# Patient Record
Sex: Female | Born: 1985 | Race: White | Hispanic: Yes | Smoking: Former smoker
Health system: Southern US, Community
[De-identification: ages and names within clinical notes are randomized; demographics above are authoritative.]

---

## 2012-10-06 DIAGNOSIS — Z3202 Encounter for pregnancy test, result negative: Secondary | ICD-10-CM | POA: Insufficient documentation

## 2013-02-18 DIAGNOSIS — Z30431 Encounter for routine checking of intrauterine contraceptive device: Secondary | ICD-10-CM | POA: Insufficient documentation

## 2013-02-18 DIAGNOSIS — N760 Acute vaginitis: Secondary | ICD-10-CM | POA: Insufficient documentation

## 2013-02-18 DIAGNOSIS — N920 Excessive and frequent menstruation with regular cycle: Secondary | ICD-10-CM | POA: Insufficient documentation

## 2013-02-18 DIAGNOSIS — Z113 Encounter for screening for infections with a predominantly sexual mode of transmission: Secondary | ICD-10-CM | POA: Insufficient documentation

## 2013-02-18 DIAGNOSIS — F329 Major depressive disorder, single episode, unspecified: Secondary | ICD-10-CM | POA: Insufficient documentation

## 2013-02-18 DIAGNOSIS — N912 Amenorrhea, unspecified: Secondary | ICD-10-CM | POA: Insufficient documentation

## 2013-02-18 DIAGNOSIS — F411 Generalized anxiety disorder: Secondary | ICD-10-CM | POA: Insufficient documentation

## 2013-02-18 DIAGNOSIS — N63 Unspecified lump in unspecified breast: Secondary | ICD-10-CM | POA: Insufficient documentation

## 2016-09-24 DIAGNOSIS — J320 Chronic maxillary sinusitis: Secondary | ICD-10-CM | POA: Insufficient documentation

## 2017-01-25 DIAGNOSIS — R0982 Postnasal drip: Secondary | ICD-10-CM | POA: Insufficient documentation

## 2017-05-03 DIAGNOSIS — K219 Gastro-esophageal reflux disease without esophagitis: Secondary | ICD-10-CM | POA: Insufficient documentation

## 2017-12-13 DIAGNOSIS — O24410 Gestational diabetes mellitus in pregnancy, diet controlled: Secondary | ICD-10-CM | POA: Insufficient documentation

## 2017-12-20 DIAGNOSIS — R801 Persistent proteinuria, unspecified: Secondary | ICD-10-CM | POA: Insufficient documentation

## 2017-12-20 DIAGNOSIS — Z3A26 26 weeks gestation of pregnancy: Secondary | ICD-10-CM | POA: Insufficient documentation

## 2017-12-29 ENCOUNTER — Other Ambulatory Visit (HOSPITAL_COMMUNITY): Payer: Self-pay | Admitting: Specialist

## 2017-12-29 DIAGNOSIS — O289 Unspecified abnormal findings on antenatal screening of mother: Secondary | ICD-10-CM

## 2018-01-06 ENCOUNTER — Encounter (HOSPITAL_COMMUNITY): Payer: Self-pay

## 2018-01-06 ENCOUNTER — Other Ambulatory Visit (HOSPITAL_COMMUNITY): Payer: Self-pay

## 2018-01-18 DIAGNOSIS — O4703 False labor before 37 completed weeks of gestation, third trimester: Secondary | ICD-10-CM | POA: Insufficient documentation

## 2018-02-04 ENCOUNTER — Ambulatory Visit (HOSPITAL_COMMUNITY): Payer: Self-pay

## 2018-02-06 DIAGNOSIS — Z3490 Encounter for supervision of normal pregnancy, unspecified, unspecified trimester: Secondary | ICD-10-CM | POA: Insufficient documentation

## 2018-02-11 ENCOUNTER — Other Ambulatory Visit (HOSPITAL_COMMUNITY): Payer: Self-pay

## 2019-07-03 DIAGNOSIS — H5213 Myopia, bilateral: Secondary | ICD-10-CM | POA: Diagnosis not present

## 2019-07-03 DIAGNOSIS — H52223 Regular astigmatism, bilateral: Secondary | ICD-10-CM | POA: Diagnosis not present

## 2019-07-06 DIAGNOSIS — H5213 Myopia, bilateral: Secondary | ICD-10-CM | POA: Diagnosis not present

## 2019-09-14 DIAGNOSIS — H547 Unspecified visual loss: Secondary | ICD-10-CM | POA: Diagnosis not present

## 2019-10-31 DIAGNOSIS — Z8709 Personal history of other diseases of the respiratory system: Secondary | ICD-10-CM | POA: Diagnosis not present

## 2019-10-31 DIAGNOSIS — F458 Other somatoform disorders: Secondary | ICD-10-CM | POA: Insufficient documentation

## 2019-10-31 DIAGNOSIS — K219 Gastro-esophageal reflux disease without esophagitis: Secondary | ICD-10-CM | POA: Diagnosis not present

## 2019-10-31 DIAGNOSIS — H9203 Otalgia, bilateral: Secondary | ICD-10-CM | POA: Insufficient documentation

## 2019-10-31 DIAGNOSIS — R29898 Other symptoms and signs involving the musculoskeletal system: Secondary | ICD-10-CM | POA: Diagnosis not present

## 2019-10-31 DIAGNOSIS — R0982 Postnasal drip: Secondary | ICD-10-CM | POA: Diagnosis not present

## 2019-10-31 DIAGNOSIS — Z87891 Personal history of nicotine dependence: Secondary | ICD-10-CM | POA: Diagnosis not present

## 2020-05-10 ENCOUNTER — Telehealth: Payer: Self-pay

## 2020-05-10 NOTE — Telephone Encounter (Signed)
Kelli Hill called requesting Dr. Sheppard Coil take her as a new patient.  She stated that she understood Dr. Sheppard Coil no longer took new patient's but she wanted her to reconsider in this instance. Dr. Sheppard Coil comes highly recommended, "by about 70 other moms on our Facebook chatroom". She states, "I'm no trouble just regular run of the [facility]."  Forwarded message to Dr. Sheppard Coil for her consideration. CMA to respond to patient with Dr. Redgie Grayer determination.

## 2020-05-14 NOTE — Telephone Encounter (Signed)
Sure can make exception

## 2020-05-14 NOTE — Telephone Encounter (Signed)
Please call patient to schedule an new patient appointment

## 2020-05-14 NOTE — Telephone Encounter (Signed)
PT advised. "Thank you so much for letting me in."

## 2020-05-29 ENCOUNTER — Other Ambulatory Visit: Payer: Self-pay

## 2020-05-29 ENCOUNTER — Encounter: Payer: Self-pay | Admitting: Osteopathic Medicine

## 2020-05-29 ENCOUNTER — Ambulatory Visit (INDEPENDENT_AMBULATORY_CARE_PROVIDER_SITE_OTHER): Payer: Medicaid Other | Admitting: Osteopathic Medicine

## 2020-05-29 VITALS — BP 136/96 | HR 73 | Temp 97.0°F | Ht 61.5 in | Wt 216.0 lb

## 2020-05-29 DIAGNOSIS — Z8049 Family history of malignant neoplasm of other genital organs: Secondary | ICD-10-CM | POA: Diagnosis not present

## 2020-05-29 DIAGNOSIS — Z3009 Encounter for other general counseling and advice on contraception: Secondary | ICD-10-CM | POA: Diagnosis not present

## 2020-05-29 DIAGNOSIS — M546 Pain in thoracic spine: Secondary | ICD-10-CM | POA: Diagnosis not present

## 2020-05-29 DIAGNOSIS — D509 Iron deficiency anemia, unspecified: Secondary | ICD-10-CM | POA: Diagnosis not present

## 2020-05-29 DIAGNOSIS — K219 Gastro-esophageal reflux disease without esophagitis: Secondary | ICD-10-CM | POA: Diagnosis not present

## 2020-05-29 DIAGNOSIS — E7800 Pure hypercholesterolemia, unspecified: Secondary | ICD-10-CM | POA: Diagnosis not present

## 2020-05-29 DIAGNOSIS — G8929 Other chronic pain: Secondary | ICD-10-CM

## 2020-05-29 DIAGNOSIS — E282 Polycystic ovarian syndrome: Secondary | ICD-10-CM | POA: Diagnosis not present

## 2020-05-29 DIAGNOSIS — B354 Tinea corporis: Secondary | ICD-10-CM | POA: Diagnosis not present

## 2020-05-29 DIAGNOSIS — N644 Mastodynia: Secondary | ICD-10-CM

## 2020-05-29 DIAGNOSIS — N62 Hypertrophy of breast: Secondary | ICD-10-CM

## 2020-05-29 DIAGNOSIS — R7309 Other abnormal glucose: Secondary | ICD-10-CM | POA: Diagnosis not present

## 2020-05-29 NOTE — Progress Notes (Signed)
Kelli Hill is a 34 y.o. female who presents to  [facility]  today, 05/29/20, seeking care for the following:  . New to establish  . Breast pain on L - shooting pain nipple to back, tender to touch and seems sensitive to temperature changes . Intermittent abdominal pain in area of C-section scar        ASSESSMENT & PLAN with other pertinent findings:  The primary encounter diagnosis was Breast pain, left. Diagnoses of PCOS (polycystic ovarian syndrome), Tinea corporis, Gastroesophageal reflux disease, unspecified whether esophagitis present, Encounter for counseling regarding contraception, and Family history of uterine cancer were also pertinent to this visit.   1. Breast pain, left Normal exam Large breasts w/ associated back pain, referral for reduction   2. PCOS (polycystic ovarian syndrome) Longstanding irregular periods and hirsutism  OK w/ another pregnancy, not on contraception, reluctant to revisit OCP d/t previous side effects, Mirena worked well before   3. Gestational diabetes history  Labs as below  4. Gastroesophageal reflux disease, unspecified whether esophagitis present Frequent TUMS use, may consider PPI  5. Encounter for counseling regarding contraception See above   6. Family history of uterine cancer   There are no Patient Instructions on file for this visit.  Orders Placed This Encounter  Procedures  . MM Digital Diagnostic Bilat  . US BREAST COMPLETE UNI RIGHT INC AXILLA  . US BREAST COMPLETE UNI LEFT INC AXILLA  . CBC  . COMPLETE METABOLIC PANEL WITH GFR  . Lipid panel  . TSH  . Hemoglobin A1c  . Pregnancy, urine    No orders of the defined types were placed in this encounter.      Follow-up instructions: Return for RECHECK PENDING RESULTS / IF WORSE OR CHANGE. ANNUAL ONCE PER YEAR.                                         BP (!) 136/96 (BP  Location: Right Arm, Patient Position: Sitting)   Pulse 73   Temp (!) 97 F (36.1 C)   Ht 5' 1.5" (1.562 m)   Wt 216 lb (98 kg)   SpO2 100%   BMI 40.15 kg/m   No outpatient medications have been marked as taking for the 05/29/20 encounter (Office Visit) with Emeterio Reeve, DO.     No results found.     All questions at time of visit were answered - patient instructed to contact office with any additional concerns or updates.  ER/RTC precautions were reviewed with the patient as applicable.   Please note: voice recognition software was used to produce this document, and typos may escape review. Please contact Dr. Sheppard Coil for any needed clarifications.

## 2020-05-30 ENCOUNTER — Other Ambulatory Visit: Payer: Self-pay | Admitting: Osteopathic Medicine

## 2020-05-30 DIAGNOSIS — N644 Mastodynia: Secondary | ICD-10-CM

## 2020-05-30 LAB — PREGNANCY, URINE: Preg Test, Ur: NEGATIVE

## 2020-05-30 NOTE — Addendum Note (Signed)
Addended by: Maryla Morrow on: 05/30/2020 05:11 PM   Modules accepted: Orders

## 2020-06-02 LAB — COMPLETE METABOLIC PANEL WITH GFR
AG Ratio: 1.5 (calc) (ref 1.0–2.5)
ALT: 13 U/L (ref 6–29)
AST: 14 U/L (ref 10–30)
Albumin: 3.9 g/dL (ref 3.6–5.1)
Alkaline phosphatase (APISO): 84 U/L (ref 31–125)
BUN/Creatinine Ratio: 13 (calc) (ref 6–22)
BUN: 15 mg/dL (ref 7–25)
CO2: 26 mmol/L (ref 20–32)
Calcium: 9.6 mg/dL (ref 8.6–10.2)
Chloride: 104 mmol/L (ref 98–110)
Creat: 1.19 mg/dL — ABNORMAL HIGH (ref 0.50–1.10)
GFR, Est African American: 70 mL/min/{1.73_m2} (ref >=60)
GFR, Est Non African American: 60 mL/min/{1.73_m2} (ref >=60)
Globulin: 2.6 g/dL (calc) (ref 1.9–3.7)
Glucose, Bld: 97 mg/dL (ref 65–139)
Potassium: 4.4 mmol/L (ref 3.5–5.3)
Sodium: 137 mmol/L (ref 135–146)
Total Bilirubin: 0.4 mg/dL (ref 0.2–1.2)
Total Protein: 6.5 g/dL (ref 6.1–8.1)

## 2020-06-02 LAB — CBC
HCT: 33.7 % — ABNORMAL LOW (ref 35.0–45.0)
Hemoglobin: 10.6 g/dL — ABNORMAL LOW (ref 11.7–15.5)
MCH: 23.9 pg — ABNORMAL LOW (ref 27.0–33.0)
MCHC: 31.5 g/dL — ABNORMAL LOW (ref 32.0–36.0)
MCV: 76.1 fL — ABNORMAL LOW (ref 80.0–100.0)
MPV: 9.4 fL (ref 7.5–12.5)
Platelets: 371 10*3/uL (ref 140–400)
RBC: 4.43 10*6/uL (ref 3.80–5.10)
RDW: 14.8 % (ref 11.0–15.0)
WBC: 5.7 10*3/uL (ref 3.8–10.8)

## 2020-06-02 LAB — LIPID PANEL
Cholesterol: 248 mg/dL — ABNORMAL HIGH (ref <200)
HDL: 63 mg/dL (ref >=50)
LDL Cholesterol (Calc): 158 mg/dL (calc) — ABNORMAL HIGH
Non-HDL Cholesterol (Calc): 185 mg/dL (calc) — ABNORMAL HIGH (ref <130)
Total CHOL/HDL Ratio: 3.9 (calc) (ref <5.0)
Triglycerides: 142 mg/dL (ref <150)

## 2020-06-02 LAB — HEMOGLOBIN A1C
Hgb A1c MFr Bld: 5.3 % of total Hgb (ref <5.7)
Mean Plasma Glucose: 105 (calc)
eAG (mmol/L): 5.8 (calc)

## 2020-06-02 LAB — TEST AUTHORIZATION

## 2020-06-02 LAB — IRON,TIBC AND FERRITIN PANEL
%SAT: 6 % (calc) — ABNORMAL LOW (ref 16–45)
Ferritin: 5 ng/mL — ABNORMAL LOW (ref 16–154)
Iron: 30 ug/dL — ABNORMAL LOW (ref 40–190)
TIBC: 464 mcg/dL (calc) — ABNORMAL HIGH (ref 250–450)

## 2020-06-02 LAB — TSH: TSH: 2.43 mIU/L

## 2020-06-17 ENCOUNTER — Ambulatory Visit: Payer: Medicaid Other

## 2020-06-17 ENCOUNTER — Other Ambulatory Visit: Payer: Self-pay

## 2020-06-17 ENCOUNTER — Ambulatory Visit
Admission: RE | Admit: 2020-06-17 | Discharge: 2020-06-17 | Disposition: A | Discharge status: Home / Self Care | Payer: Medicaid Other | Source: Ambulatory Visit | Attending: Osteopathic Medicine | Admitting: Osteopathic Medicine

## 2020-06-17 DIAGNOSIS — N644 Mastodynia: Secondary | ICD-10-CM | POA: Diagnosis not present

## 2020-06-24 ENCOUNTER — Encounter: Payer: Self-pay | Admitting: Osteopathic Medicine

## 2020-06-24 ENCOUNTER — Ambulatory Visit (INDEPENDENT_AMBULATORY_CARE_PROVIDER_SITE_OTHER): Payer: Medicaid Other | Admitting: Osteopathic Medicine

## 2020-06-24 VITALS — BP 110/77 | HR 92 | Wt 216.0 lb

## 2020-06-24 DIAGNOSIS — K219 Gastro-esophageal reflux disease without esophagitis: Secondary | ICD-10-CM

## 2020-06-24 DIAGNOSIS — D509 Iron deficiency anemia, unspecified: Secondary | ICD-10-CM | POA: Insufficient documentation

## 2020-06-24 DIAGNOSIS — E282 Polycystic ovarian syndrome: Secondary | ICD-10-CM | POA: Diagnosis not present

## 2020-06-24 DIAGNOSIS — Z6841 Body Mass Index (BMI) 40.0 and over, adult: Secondary | ICD-10-CM | POA: Diagnosis not present

## 2020-06-24 DIAGNOSIS — M7712 Lateral epicondylitis, left elbow: Secondary | ICD-10-CM

## 2020-06-24 DIAGNOSIS — M546 Pain in thoracic spine: Secondary | ICD-10-CM | POA: Insufficient documentation

## 2020-06-24 DIAGNOSIS — N644 Mastodynia: Secondary | ICD-10-CM | POA: Insufficient documentation

## 2020-06-24 DIAGNOSIS — G8929 Other chronic pain: Secondary | ICD-10-CM | POA: Insufficient documentation

## 2020-06-24 DIAGNOSIS — Z8049 Family history of malignant neoplasm of other genital organs: Secondary | ICD-10-CM | POA: Insufficient documentation

## 2020-06-24 NOTE — Progress Notes (Signed)
Kelli Hill is a 34 y.o. female who presents to  [facility] [facility]  today, 06/24/20, seeking care for the following:  . Arm pain - L elbow pain ongoing x3 months.  Exam positive for lateral epicondylitis.  Weight - seems to be gaining weight despite diet/exercise.  PCOS has made things challenging, patient is feeling very down on herself because of her weight, significant depression/anxiety.  Reluctant to trial medications for weight/mood    ASSESSMENT & PLAN with other pertinent findings:  The primary encounter diagnosis was BMI 40.0-44.9, adult ([place]). Diagnoses of PCOS (polycystic ovarian syndrome), Gastroesophageal reflux disease, unspecified whether esophagitis present, and Lateral epicondylitis of left elbow were also pertinent to this visit.   Given reluctance to start medications, I think a discussion with bariatric specialist about nonmedication management options might be beneficial, especially in light of her PCOS.  Patient is advised that if mental health deteriorates such that she would like to talk more about potential medication or therapy/counseling, let me know.  Home exercises provided for lateral epicondylitis, advised follow-up with sports medicine if no improvement.  No results found for this or any previous visit (from the past 24 hour(s)).  Wt Readings from Last 3 Encounters:  06/24/20 216 lb (98 kg)  05/29/20 216 lb (98 kg)    BP 110/77 (BP Location: Left Arm, Patient Position: Sitting)   Pulse 92   Wt 216 lb (98 kg)   LMP 06/02/2020   SpO2 97%   BMI 40.15 kg/m    Patient Instructions  Weight loss: important things to remember  Things to remember for exercise for weight loss:   Please note - I am not a certified personal trainer. I can present you with ideas and general workout goals, but an exercise program is largely up to you. Find something you can stick with, and something you enjoy!   Slow  progression will help prevent injury!  As you progress in your exercise regimen think about gradually increasing the following, week by week:   intensity (how strenuous is your workout?)  frequency (how often are you exercising?)  duration (how many minutes at a time are you exercising?)  Walking for 20 minutes a day is certainly better than nothing, but more strenuous exercise will develop better cardiovascular fitness.   interval training (high-intensity alternating with low-intensity, think walk/jog rather than just walk)  muscle strengthening exercises (weight lifting, calisthenics, yoga) - this also helps prevent osteoporosis!   Things to remember for diet changes for weight loss:   Please note - I am not a certified dietician. I can present you with ideas and general diet goals, but a meal plan is largely up to you. I am happy to refer you to a dietician who can give you a detailed meal plan.  Apps/logs can be helpful to track how you're eating! It's not realistic to be logging everything you eat forever, but when you're starting a healthy eating lifestyle it's very helpful, and checking in with logs now and then helps you stick to your program! (Logging calories for the first week of every month, for example)   Calorie restriction / portion control with the goal weight loss of no more than one to one and a half pounds per week. MAX 2 pounds per week.   Increase lean protein such as chicken, fish, [place].   Decrease fatty foods such as heavy dairy, butter.   Decrease sugary foods and sweets. Avoid sugary  drinks such as soda or juice.  Increase fiber found in vegetables, whole grains. Fruits are ok but can be sugary.   Adding psyllium fiber (Metamucil, others) prior to meals can help you feel more full and help you eat less. Add this gradually to avoid bloating.   Medications approved for long-term use for obesity  Qsymia (Phentermine + Topiramate) - may help  migraines/headaches  Saxenda (Liraglutide daily injections) - will also help diabetes/prediabetes  Wegovy (Semaglutide weekly injections) - will also help diabetes/prediabetes  Contrave (Bupropion + Naltrexone) - may help mental health/depression   Orlistat (Xenical Rx, Alli OTC) - not my favorite, can cause diarrhea  Bupropion (Wellbutrin) - generic antidepressant, will also help mental health, may help with quitting smoking  I recommend that you research the above medications and see which one(s) your insurance may or may not cover: If you call your insurance, ask them specifically what medications are on their formulary that are approved for obesity treatment. They should be able to send you a list or tell you over the phone. Remember, medications aren't magic! You MUST be diligent about lifestyle changes as well!      Orders Placed This Encounter  Procedures  . Ambulatory referral to [facility]    No orders of the defined types were placed in this encounter.      Follow-up instructions: Return if symptoms worsen or fail to improve.                                         BP 110/77 (BP Location: Left Arm, Patient Position: Sitting)   Pulse 92   Wt 216 lb (98 kg)   LMP 06/02/2020   SpO2 97%   BMI 40.15 kg/m   No outpatient medications have been marked as taking for the 06/24/20 encounter (Office Visit) with Emeterio Reeve, DO.    No results found for this or any previous visit (from the past 72 hour(s)).  No results found.     All questions at time of visit were answered - patient instructed to contact office with any additional concerns or updates.  ER/RTC precautions were reviewed with the patient as applicable.   Please note: voice recognition software was used to produce this document, and typos may escape review. Please contact Dr. Sheppard Coil for any needed clarifications.   Total encounter time: 40 minutes.

## 2020-06-24 NOTE — Patient Instructions (Addendum)
Weight loss: important things to remember  Things to remember for exercise for weight loss:   Please note - I am not a certified personal trainer. I can present you with ideas and general workout goals, but an exercise program is largely up to you. Find something you can stick with, and something you enjoy!   Slow progression will help prevent injury!  As you progress in your exercise regimen think about gradually increasing the following, week by week:   intensity (how strenuous is your workout?)  frequency (how often are you exercising?)  duration (how many minutes at a time are you exercising?)  Walking for 20 minutes a day is certainly better than nothing, but more strenuous exercise will develop better cardiovascular fitness.   interval training (high-intensity alternating with low-intensity, think walk/jog rather than just walk)  muscle strengthening exercises (weight lifting, calisthenics, yoga) - this also helps prevent osteoporosis!   Things to remember for diet changes for weight loss:   Please note - I am not a certified dietician. I can present you with ideas and general diet goals, but a meal plan is largely up to you. I am happy to refer you to a dietician who can give you a detailed meal plan.  Apps/logs can be helpful to track how you're eating! It's not realistic to be logging everything you eat forever, but when you're starting a healthy eating lifestyle it's very helpful, and checking in with logs now and then helps you stick to your program! (Logging calories for the first week of every month, for example)   Calorie restriction / portion control with the goal weight loss of no more than one to one and a half pounds per week. MAX 2 pounds per week.   Increase lean protein such as chicken, fish, [place].   Decrease fatty foods such as heavy dairy, butter.   Decrease sugary foods and sweets. Avoid sugary drinks such as soda or juice.  Increase fiber found in  vegetables, whole grains. Fruits are ok but can be sugary.   Adding psyllium fiber (Metamucil, others) prior to meals can help you feel more full and help you eat less. Add this gradually to avoid bloating.   Medications approved for long-term use for obesity  Qsymia (Phentermine + Topiramate) - may help migraines/headaches  Saxenda (Liraglutide daily injections) - will also help diabetes/prediabetes  Wegovy (Semaglutide weekly injections) - will also help diabetes/prediabetes  Contrave (Bupropion + Naltrexone) - may help mental health/depression   Orlistat (Xenical Rx, Alli OTC) - not my favorite, can cause diarrhea  Bupropion (Wellbutrin) - generic antidepressant, will also help mental health, may help with quitting smoking  I recommend that you research the above medications and see which one(s) your insurance may or may not cover: If you call your insurance, ask them specifically what medications are on their formulary that are approved for obesity treatment. They should be able to send you a list or tell you over the phone. Remember, medications aren't magic! You MUST be diligent about lifestyle changes as well!

## 2020-07-08 ENCOUNTER — Other Ambulatory Visit: Payer: Self-pay

## 2020-07-08 ENCOUNTER — Encounter: Payer: Self-pay | Admitting: Plastic Surgery

## 2020-07-08 ENCOUNTER — Ambulatory Visit (INDEPENDENT_AMBULATORY_CARE_PROVIDER_SITE_OTHER): Payer: Medicaid Other | Admitting: Plastic Surgery

## 2020-07-08 VITALS — BP 138/98 | HR 92 | Temp 98.2°F | Ht 61.0 in | Wt 218.0 lb

## 2020-07-08 DIAGNOSIS — M546 Pain in thoracic spine: Secondary | ICD-10-CM | POA: Diagnosis not present

## 2020-07-08 DIAGNOSIS — N62 Hypertrophy of breast: Secondary | ICD-10-CM | POA: Diagnosis not present

## 2020-07-08 DIAGNOSIS — M4004 Postural kyphosis, thoracic region: Secondary | ICD-10-CM

## 2020-07-08 DIAGNOSIS — M545 Low back pain, unspecified: Secondary | ICD-10-CM | POA: Diagnosis not present

## 2020-07-08 DIAGNOSIS — M793 Panniculitis, unspecified: Secondary | ICD-10-CM

## 2020-07-08 NOTE — Progress Notes (Signed)
Referring Provider Emeterio Reeve, [address] [place],  [place] [address]   CC:  Chief Complaint  Patient presents with   Advice Only      Kelli Hill is an 34 y.o. female.  HPI: Patient presents to discuss breast reduction.  She had years of back pain, neck pain and shoulder grooving related to her large breasts.  She is currently very large and wants to be around a C cup.  She did have a mammogram due to pain which was normal.  She tried over-the-counter medications, warm packs, cold packs and compressive garments to treat her pain with little relief.  She also intermittently gets rashes beneath the breast that been refractory to over-the-counter treatments.  She does not smoke and is nondiabetic.  Her youngest children are 42 years old.  She is also bothered by her overhanging abdominal pannus.  She has lost a bit of weight but has been at her current weight for at least 3 months.  Her only abdominal operations are C-sections.  She will intermittently get rashes beneath the crease that have been refractory to over-the-counter treatments.  Allergies  Allergen Reactions   Iodine Nausea And Vomiting    IV and Oral Contrast Iodine only. NO ISSUES WITH BETADINE   Contrast Media [Iodinated Diagnostic Agents] Nausea And Vomiting    Outpatient Encounter Medications as of 07/08/2020  Medication Sig   calcium carbonate (TUMS - DOSED IN MG ELEMENTAL CALCIUM) 500 MG chewable tablet Chew 1 tablet by mouth daily.   No facility-administered encounter medications on file as of 07/08/2020.     No past medical history on file.   Family History  Problem Relation Age of Onset   Endometrial cancer Mother    Lung cancer Mother    Kidney disease Mother        stage 3   Hypertension Mother    Ovarian cancer Mother    Diabetes Maternal Grandmother    Diabetes Maternal Grandfather    Skin cancer Maternal Grandfather     Social History   Social History  Narrative   Not on file  Denies tobacco use  Review of Systems General: Denies fevers, chills, weight loss CV: Denies chest pain, shortness of breath, palpitations  Physical Exam Vitals with BMI 07/08/2020 06/24/2020 05/29/2020  Height 5\' 1"  - 5' 1.5"  Weight 218 lbs 216 lbs 216 lbs  BMI 41.21 60.45 40.98  Systolic 119 147 829  Diastolic 98 77 96  Pulse 92 92 73    General:  No acute distress,  Alert and oriented, Non-Toxic, Normal speech and affect Breast: She has grade 3 ptosis.  Sternal notch to nipple is 37 cm on the right and 36 cm on the left.  Nipple to fold is 20 cm on the right and 18 cm on the left.  I do not see any obvious scars or masses. Abdomen: Abdomen is soft nontender.  I do not appreciate any hernias.  She has excess skin and adipose tissue and a clear overhanging pannus with skin irritation in the crease.  She has a lower Pfannenstiel incision from her C-sections.  Assessment/Plan The patient has bilateral symptomatic macromastia.  She is a good candidate for a breast reduction.  She is interested in pursuing surgical treatment.  She has tried supportive garments and fitted bras with no relief.  The details of breast reduction surgery were discussed.  I explained the procedure in detail along the with the expected scars.  The risks were discussed in detail and include bleeding, infection, damage to surrounding structures, need for additional procedures, nipple loss, change in nipple sensation, persistent pain, contour irregularities and asymmetries.  I explained that breast feeding is often not possible after breast reduction surgery.  We discussed the expected postoperative course with an overall recovery period of about 1 month.  She demonstrated full understanding of all risks.  We discussed her personal risk factors that include her BMI  I anticipate approximately 700g of tissue removed from each side.  We also discussed panniculectomy for her abdominal pannus.  We  discussed the risks include bleeding, infection, damage to surrounding structures and need for additional procedures.  I discussed that this would not address the upper abdomen and would focus on what would be over hanging beneath the umbilicus.  I discussed the need for drains and the expected recovery postoperatively.  I discussed the additional challenges of combining this with the breast reduction but I think it could be done safely for her if she is motivated.  She is interested in pursuing both of these at the same time.   [name] 07/08/2020, 12:43 PM

## 2020-07-26 ENCOUNTER — Encounter: Payer: Self-pay | Admitting: Osteopathic Medicine

## 2020-08-06 MED ORDER — KETOCONAZOLE 2 % EX CREA: 1.0000 "application " | TOPICAL_CREAM | Freq: Two times a day (BID) | CUTANEOUS | 3 refills | Status: DC

## 2020-08-06 MED ORDER — OZEMPIC (0.25 OR 0.5 MG/DOSE) 2 MG/1.5ML ~~LOC~~ SOPN: PEN_INJECTOR | SUBCUTANEOUS | 1 refills | Status: DC

## 2020-08-15 MED ORDER — OZEMPIC (0.25 OR 0.5 MG/DOSE) 2 MG/1.5ML ~~LOC~~ SOPN: PEN_INJECTOR | SUBCUTANEOUS | 1 refills | Status: DC

## 2020-08-15 NOTE — Addendum Note (Signed)
Addended by: Towana Badger on: 08/15/2020 03:57 PM   Modules accepted: Orders

## 2020-08-19 ENCOUNTER — Telehealth: Payer: Self-pay | Admitting: *Deleted

## 2020-08-19 NOTE — Telephone Encounter (Signed)
PA submitted through Cover My Meds for Oxempic.

## 2020-08-20 ENCOUNTER — Encounter: Payer: Self-pay | Admitting: Osteopathic Medicine

## 2020-08-22 MED ORDER — WEGOVY 0.25 MG/0.5ML ~~LOC~~ SOAJ: 0.2500 mg | SUBCUTANEOUS | 0 refills | Status: AC

## 2020-08-22 MED ORDER — WEGOVY 1 MG/0.5ML ~~LOC~~ SOAJ: 1.0000 mg | SUBCUTANEOUS | 0 refills | Status: AC

## 2020-08-22 MED ORDER — WEGOVY 0.5 MG/0.5ML ~~LOC~~ SOAJ: 0.5000 mg | SUBCUTANEOUS | 0 refills | Status: AC

## 2020-08-22 NOTE — Telephone Encounter (Signed)
Sent!

## 2020-08-22 NOTE — Telephone Encounter (Signed)
Patient switched to [facility].

## 2020-08-22 NOTE — Telephone Encounter (Signed)
Can you please send Wegovy to [facility] here in [place] for her?

## 2020-09-05 NOTE — Telephone Encounter (Signed)
Kelli Hill (Key: [id number]) [phone] Ozempic (0.25 or 0.5 MG/DOSE) 2MG /1.5ML pen-injectors     Status: New - Denied  Created: December 7th, 2021 [phone]  Open  Archived - Outcome: Denied Archive Reason: Did not try and fail formulary alternative

## 2020-09-08 DIAGNOSIS — R1031 Right lower quadrant pain: Secondary | ICD-10-CM | POA: Diagnosis not present

## 2020-09-08 DIAGNOSIS — N3001 Acute cystitis with hematuria: Secondary | ICD-10-CM | POA: Diagnosis not present

## 2020-09-08 DIAGNOSIS — K449 Diaphragmatic hernia without obstruction or gangrene: Secondary | ICD-10-CM | POA: Diagnosis not present

## 2020-10-01 ENCOUNTER — Encounter: Payer: Self-pay | Admitting: Plastic Surgery

## 2020-10-02 ENCOUNTER — Ambulatory Visit: Payer: Medicaid Other | Admitting: Plastic Surgery

## 2020-10-13 ENCOUNTER — Encounter: Payer: Medicaid Other | Admitting: Osteopathic Medicine

## 2020-10-13 DIAGNOSIS — B354 Tinea corporis: Secondary | ICD-10-CM | POA: Diagnosis not present

## 2020-10-13 DIAGNOSIS — N62 Hypertrophy of breast: Secondary | ICD-10-CM

## 2020-10-15 MED ORDER — NYSTATIN POWD: 11 refills | Status: DC

## 2020-10-15 NOTE — Telephone Encounter (Signed)
Patient contacted office via MyChart >7 days from their last visit, about an issue related to that visit. They did this independently (not in response to inquiry from this office). 5 minutes spent by myself and our staff in addressing this issue. Billed appropriately for diagnosis/diagnoses below:  The primary encounter diagnosis was Tinea corporis. A diagnosis of Macromastia was also pertinent to this visit.  Meds ordered this encounter  Medications  . Nystatin POWD    Sig: Apply liberally to affected area 2 times per day    Dispense:  3 each    Refill:  11    No orders of the defined types were placed in this encounter.   See MyChart message

## 2020-10-29 ENCOUNTER — Encounter: Payer: Self-pay | Admitting: Medical-Surgical

## 2020-10-29 ENCOUNTER — Other Ambulatory Visit: Payer: Self-pay

## 2020-10-29 ENCOUNTER — Ambulatory Visit (INDEPENDENT_AMBULATORY_CARE_PROVIDER_SITE_OTHER): Payer: Medicaid Other

## 2020-10-29 ENCOUNTER — Ambulatory Visit (INDEPENDENT_AMBULATORY_CARE_PROVIDER_SITE_OTHER): Payer: Medicaid Other | Admitting: Medical-Surgical

## 2020-10-29 VITALS — BP 129/83 | HR 83 | Temp 98.3°F | Ht 61.0 in | Wt 207.5 lb

## 2020-10-29 DIAGNOSIS — M79652 Pain in left thigh: Secondary | ICD-10-CM | POA: Diagnosis not present

## 2020-10-29 DIAGNOSIS — N898 Other specified noninflammatory disorders of vagina: Secondary | ICD-10-CM | POA: Diagnosis not present

## 2020-10-29 DIAGNOSIS — M545 Low back pain, unspecified: Secondary | ICD-10-CM | POA: Diagnosis not present

## 2020-10-29 LAB — WET PREP FOR TRICH, YEAST, CLUE
MICRO NUMBER:: 11536125
Specimen Quality: ADEQUATE

## 2020-10-29 MED ORDER — METRONIDAZOLE 500 MG PO TABS: 500.0000 mg | ORAL_TABLET | Freq: Two times a day (BID) | ORAL | 0 refills | Status: DC

## 2020-10-29 MED ORDER — LIDOCAINE 5 % EX PTCH: 1.0000 | MEDICATED_PATCH | CUTANEOUS | 0 refills | Status: DC

## 2020-10-29 NOTE — Progress Notes (Signed)
Subjective:    CC: thigh pain, vaginal discharge  HPI: Pleasant 35 year old female presenting for the following:  Thigh pain- started in her left thigh about 11 days ago. Notes that the pain started on the anterior thigh as a burning sensation but has now spread to encompass the entire anterior and lateral thigh, stopping just above her knee. Feels that the pain is a 6/10, described as the burn of a sunburn that has been rubbed against. Notes that there is no numbness or tingling in the thight. She has been giving her Wegovy injections in her bilateral thighs but has not developed symptoms in the right thigh. Does have 33 year old twins and is overweight in the abdominal area. No weakness of the left leg and no discoloration of the skin.   Vaginal discharge- has had frequent vaginal infections and feels like she keeps a UTI or vaginal issue. Has had her urine tested and reports there is always bacteria present even when she has no symptoms. Today, has vaginal odor along with discharge. Is not sure if this is related to a UTI or another vaginal infection.   I reviewed the past medical history, family history, social history, surgical history, and allergies today and no changes were needed.  Please see the problem list section below in epic for further details.  Past Medical History: History reviewed. No pertinent past medical history. Past Surgical History: History reviewed. No pertinent surgical history. Social History: Social History   Socioeconomic History  . Marital status: Significant Other    Spouse name: Not on file  . Number of children: Not on file  . Years of education: Not on file  . Highest education level: Not on file  Occupational History  . Not on file  Tobacco Use  . Smoking status: Former Research scientist (life sciences)  . Smokeless tobacco: Never Used  Vaping Use  . Vaping Use: Never used  Substance and Sexual Activity  . Alcohol use: Never  . Drug use: Never  . Sexual activity: Yes     Partners: Female, Female    Birth control/protection: None  Other Topics Concern  . Not on file  Social History Narrative  . Not on file   Social Determinants of Health   Financial Resource Strain: Not on file  Food Insecurity: Not on file  Transportation Needs: Not on file  Physical Activity: Not on file  Stress: Not on file  Social Connections: Not on file   Family History: Family History  Problem Relation Age of Onset  . Endometrial cancer Mother   . Lung cancer Mother   . Kidney disease Mother        stage 3  . Hypertension Mother   . Ovarian cancer Mother   . Diabetes Maternal Grandmother   . Diabetes Maternal Grandfather   . Skin cancer Maternal Grandfather    Allergies: Allergies  Allergen Reactions  . Iodine Nausea And Vomiting    IV and Oral Contrast Iodine only. NO ISSUES WITH BETADINE  . Contrast Media [Iodinated Diagnostic Agents] Nausea And Vomiting   Medications: See med rec.  Review of Systems: See HPI for pertinent positives and negatives.   Objective:    General: Well Developed, well nourished, and in no acute distress.  Neuro: Alert and oriented x3.  HEENT: Normocephalic, atraumatic.  Skin: Warm and dry. Cardiac: Regular rate and rhythm, no murmurs rubs or gallops, no lower extremity edema.  Respiratory: Clear to auscultation bilaterally. Not using accessory muscles, speaking in full sentences.  Left leg: Neurovascularly intact. No muscle weakness. Full ROM with normal gait.   Impression and Recommendations:    1. Left thigh pain Symptoms consistent with meralgia paresthetica but cannot rule out lumbar spinal etiology. Getting lumbar x-rays today. Discussed treatment with anti-inflammatories and neuromodulators such as gabapentin. She is very skittish with prescription medications and would like to avoid them if possible. Recommend doing a trial of Ibuprofen 600mg  every 6 hours while awake. Can also try lidocaine patches or Voltaren gel to the  affected area to see if this will help. Recommend avoiding constrictive clothing and pressure on the lower abdomen and anterior left pelvis/hip.  - DG Lumbar Spine Complete; Future  2. Vaginal discharge Stat wet prep collected by self swab. Results show + for BV. Start Flagyl BID for 7 days.  - WET PREP FOR [place], YEAST, CLUE  Return if symptoms worsen or fail to improve. ___________________________________________ Clearnce Sorrel, DNP, APRN, FNP-BC [facility] and [place]

## 2020-10-30 MED ORDER — METRONIDAZOLE 0.75 % VA GEL: 1.0000 | Freq: Every day | VAGINAL | 0 refills | Status: DC

## 2020-11-05 ENCOUNTER — Encounter: Payer: Self-pay | Admitting: Osteopathic Medicine

## 2020-11-15 ENCOUNTER — Encounter: Payer: Self-pay | Admitting: Osteopathic Medicine

## 2020-11-15 ENCOUNTER — Other Ambulatory Visit: Payer: Self-pay | Admitting: Osteopathic Medicine

## 2020-11-15 MED ORDER — WEGOVY 1.7 MG/0.75ML ~~LOC~~ SOAJ: 1.7000 mg | SUBCUTANEOUS | 0 refills | Status: DC

## 2020-11-18 ENCOUNTER — Telehealth: Payer: Self-pay

## 2020-11-18 MED ORDER — WEGOVY 1.7 MG/0.75ML ~~LOC~~ SOAJ: 1.7000 mg | SUBCUTANEOUS | 0 refills | Status: DC

## 2020-11-18 NOTE — Telephone Encounter (Signed)
Prior authorization for [facility] sent to patient's insurance. Pending a determination.

## 2020-11-19 NOTE — Telephone Encounter (Signed)
Hmm it Is for weight loss, see note 06/2020, patient might need office visit to document progress before insurance wil continue to cover this - please call her to arrange follow-up in office

## 2020-11-19 NOTE — Telephone Encounter (Signed)
Left voicemail for patient to call us back.

## 2020-11-19 NOTE — Telephone Encounter (Signed)
Prior authorization for Wegovy rx denied by patient's insurance. Per insurance - "the requested drug is being used for weight loss. Drugs used for this reason are not covered under your plan. Please speak with your doctor about your choices".

## 2020-11-21 ENCOUNTER — Telehealth: Payer: Self-pay | Admitting: Neurology

## 2020-11-21 NOTE — Telephone Encounter (Signed)
Prior Authorization for Lidocaine 5% patches submitted via covermymeds. Awaiting response.

## 2020-11-22 ENCOUNTER — Telehealth: Payer: Self-pay | Admitting: Medical-Surgical

## 2020-11-22 NOTE — Telephone Encounter (Signed)
Lidocaine patches denied by insurance for coverage. These are available over the counter for a reasonable price. Please contact patient to let her know of the denial and the OTC availability.

## 2020-11-25 NOTE — Telephone Encounter (Signed)
Patient aware of denial and recommendations via voicemail. Instructed patient to call back if there are any questions or concerns.

## 2020-11-27 ENCOUNTER — Encounter: Payer: Self-pay | Admitting: Medical-Surgical

## 2020-12-17 ENCOUNTER — Encounter: Payer: Self-pay | Admitting: Osteopathic Medicine

## 2020-12-17 ENCOUNTER — Other Ambulatory Visit: Payer: Self-pay | Admitting: Osteopathic Medicine

## 2020-12-17 MED ORDER — WEGOVY 2.4 MG/0.75ML ~~LOC~~ SOAJ: 2.4000 mg | SUBCUTANEOUS | 5 refills | Status: DC

## 2020-12-18 ENCOUNTER — Other Ambulatory Visit: Payer: Self-pay | Admitting: *Deleted

## 2020-12-18 MED ORDER — WEGOVY 2.4 MG/0.75ML ~~LOC~~ SOAJ: 2.4000 mg | SUBCUTANEOUS | 5 refills | Status: DC

## 2020-12-18 NOTE — Telephone Encounter (Signed)
Currently working on obtaining prior authorization from insurance.

## 2020-12-19 ENCOUNTER — Telehealth: Payer: Self-pay

## 2020-12-19 NOTE — Telephone Encounter (Signed)
Prior authorization for [employer] submitted to insurance. Pending a determination.

## 2020-12-20 NOTE — Telephone Encounter (Signed)
Prior authorization for Wegovy denied by insurance. Per pt's insurance - [facility] does not cover the following service(s) in the [facility].

## 2020-12-24 ENCOUNTER — Other Ambulatory Visit: Payer: Self-pay

## 2020-12-24 ENCOUNTER — Encounter: Payer: Self-pay | Admitting: Osteopathic Medicine

## 2020-12-24 ENCOUNTER — Telehealth (INDEPENDENT_AMBULATORY_CARE_PROVIDER_SITE_OTHER): Payer: Medicaid Other | Admitting: Sports Medicine

## 2020-12-24 DIAGNOSIS — R059 Cough, unspecified: Secondary | ICD-10-CM

## 2020-12-24 MED ORDER — DEXAMETHASONE 4 MG PO TABS: 4.0000 mg | ORAL_TABLET | Freq: Three times a day (TID) | ORAL | 0 refills | Status: DC

## 2020-12-24 MED ORDER — AZITHROMYCIN 250 MG PO TABS: ORAL_TABLET | ORAL | 0 refills | Status: DC

## 2020-12-24 MED ORDER — BENZONATATE 200 MG PO CAPS: 200.0000 mg | ORAL_CAPSULE | Freq: Three times a day (TID) | ORAL | 0 refills | Status: DC | PRN

## 2020-12-24 NOTE — Telephone Encounter (Signed)
Patient scheduled.

## 2020-12-24 NOTE — Assessment & Plan Note (Addendum)
This is a pleasant 35 year old female, she is a few days of productive cough, post tussive emesis. Potential blood-tinged sputum. Mild fevers and chills. Unfortunately with the video visit I am unable to fully examine her. We will assume community-acquired pneumonia, she will drop by sometime today or tomorrow for a chest x-ray, adding Decadron as she declines prednisone, azithromycin, Tussionex, she will get over-the-counter Monistat if she gets a yeast infection, declines Diflucan. No anosmia or ageusia, she will however get an over-the-counter home Covid test when she is at the pharmacy. Follow-up will be within a week or 2 if not feeling better.

## 2020-12-24 NOTE — Progress Notes (Signed)
   Virtual Visit via WebEx/MyChart   I connected with  Kelli Hill  on 12/24/20 via WebEx/MyChart/Doximity Video and verified that I am speaking with the correct person using two identifiers.   I discussed the limitations, risks, security and privacy concerns of performing an evaluation and management service by WebEx/MyChart/Doximity Video, including the higher likelihood of inaccurate diagnosis and treatment, and the availability of in person appointments.  We also discussed the likely need of an additional face to face encounter for complete and high quality delivery of care.  I also discussed with the patient that there may be a patient responsible charge related to this service. The patient expressed understanding and wishes to proceed.  Provider location is in medical facility. Patient location is at their home, different from provider location. People involved in care of the patient during this telehealth encounter were myself, my nurse/medical assistant, and my front office/scheduling team member.  Review of Systems: No fevers, chills, night sweats, weight loss, chest pain, or shortness of breath.   Objective Findings:    General: Speaking full sentences, no audible heavy breathing.  Sounds alert and appropriately interactive.  Appears well.  Face symmetric.  Extraocular movements intact.  Pupils equal and round.  No nasal flaring or accessory muscle use visualized.  Independent interpretation of tests performed by another provider:   None.  Brief History, Exam, Impression, and Recommendations:    Coughing This is a pleasant 35 year old female, she is a few days of productive cough, post tussive emesis. Potential blood-tinged sputum. Mild fevers and chills. Unfortunately with the video visit I am unable to fully examine Hill. We will assume community-acquired pneumonia, she will drop by sometime today or tomorrow for a chest x-ray, adding Decadron as she declines prednisone,  azithromycin, Tussionex, she will get over-the-counter Monistat if she gets a yeast infection, declines Diflucan. No anosmia or ageusia, she will however get an over-the-counter home Covid test when she is at the pharmacy. Follow-up will be within a week or 2 if not feeling better.   I discussed the above assessment and treatment plan with the patient. The patient was provided an opportunity to ask questions and all were answered. The patient agreed with the plan and demonstrated an understanding of the instructions.   The patient was advised to call back or seek an in-person evaluation if the symptoms worsen or if the condition fails to improve as anticipated.   I provided 30 minutes of face to face and non-face-to-face time during this encounter date, time was needed to gather information, review chart, records, communicate/coordinate with staff remotely, as well as complete documentation.   ___________________________________________ Kelli Hill. Kelli Hill, M.D., ABFM., CAQSM. Primary Care and [place] Instructor of [place] [employer] [facility]

## 2020-12-24 NOTE — Progress Notes (Signed)
Yesterday post-nasal drip and dry cough. Body aches, temp 99.1. vomiting. Productive cough today. Chest hurts to cough, out of breath doing regular activities. Stepson had a cough on Thursday. Feels tired and weak with chest heaviness and chills. Has tried Tylenol flu and cold this morning.. Patient has GERD and uses a lot of Tums daily. She is reporting some pink to burgundy vomit.

## 2020-12-25 ENCOUNTER — Ambulatory Visit (INDEPENDENT_AMBULATORY_CARE_PROVIDER_SITE_OTHER): Payer: Medicaid Other

## 2020-12-25 DIAGNOSIS — R0602 Shortness of breath: Secondary | ICD-10-CM

## 2020-12-25 DIAGNOSIS — R509 Fever, unspecified: Secondary | ICD-10-CM

## 2020-12-25 DIAGNOSIS — R059 Cough, unspecified: Secondary | ICD-10-CM | POA: Diagnosis not present

## 2021-01-18 ENCOUNTER — Other Ambulatory Visit: Payer: Self-pay | Admitting: Osteopathic Medicine

## 2021-01-23 ENCOUNTER — Telehealth: Payer: Self-pay

## 2021-01-23 DIAGNOSIS — E559 Vitamin D deficiency, unspecified: Secondary | ICD-10-CM | POA: Insufficient documentation

## 2021-01-23 DIAGNOSIS — K589 Irritable bowel syndrome without diarrhea: Secondary | ICD-10-CM | POA: Insufficient documentation

## 2021-01-23 DIAGNOSIS — R635 Abnormal weight gain: Secondary | ICD-10-CM | POA: Insufficient documentation

## 2021-01-23 DIAGNOSIS — I10 Essential (primary) hypertension: Secondary | ICD-10-CM | POA: Insufficient documentation

## 2021-01-23 DIAGNOSIS — G4730 Sleep apnea, unspecified: Secondary | ICD-10-CM | POA: Insufficient documentation

## 2021-01-23 DIAGNOSIS — M26609 Unspecified temporomandibular joint disorder, unspecified side: Secondary | ICD-10-CM | POA: Insufficient documentation

## 2021-01-23 DIAGNOSIS — O30009 Twin pregnancy, unspecified number of placenta and unspecified number of amniotic sacs, unspecified trimester: Secondary | ICD-10-CM | POA: Insufficient documentation

## 2021-01-23 DIAGNOSIS — D259 Leiomyoma of uterus, unspecified: Secondary | ICD-10-CM | POA: Insufficient documentation

## 2021-01-23 NOTE — Telephone Encounter (Signed)
Prior authorization for [employer] submitted to insurance. Denied by insurance.

## 2021-02-04 DIAGNOSIS — R0982 Postnasal drip: Secondary | ICD-10-CM | POA: Diagnosis not present

## 2021-02-04 DIAGNOSIS — H93293 Other abnormal auditory perceptions, bilateral: Secondary | ICD-10-CM | POA: Diagnosis not present

## 2021-02-04 DIAGNOSIS — K219 Gastro-esophageal reflux disease without esophagitis: Secondary | ICD-10-CM | POA: Diagnosis not present

## 2021-02-13 ENCOUNTER — Encounter: Payer: Self-pay | Admitting: Osteopathic Medicine

## 2021-02-16 ENCOUNTER — Other Ambulatory Visit: Payer: Self-pay | Admitting: Osteopathic Medicine

## 2021-02-17 MED ORDER — WEGOVY 2.4 MG/0.75ML ~~LOC~~ SOAJ: 2.4000 mg | SUBCUTANEOUS | 5 refills | Status: DC

## 2021-02-20 ENCOUNTER — Telehealth: Payer: Self-pay

## 2021-02-20 NOTE — Telephone Encounter (Signed)
Notification through Covermymeds that a PA was needed for patient's Wegovy 2.4mg /0.62ml. PA completed and submitted; awaiting response.

## 2021-02-21 NOTE — Telephone Encounter (Signed)
Fax received from [facility] stating that the PA request for [facility] Inj 2.4 mg is a duplicate request. Original PA case number is [id number] and it was denied. Fax states decision letters were mailed on 02/17/21

## 2021-02-24 ENCOUNTER — Encounter: Payer: Self-pay | Admitting: Osteopathic Medicine

## 2021-02-24 ENCOUNTER — Telehealth (INDEPENDENT_AMBULATORY_CARE_PROVIDER_SITE_OTHER): Payer: Medicaid Other | Admitting: Osteopathic Medicine

## 2021-02-24 VITALS — HR 91 | Wt 184.0 lb

## 2021-02-24 DIAGNOSIS — B9689 Other specified bacterial agents as the cause of diseases classified elsewhere: Secondary | ICD-10-CM | POA: Diagnosis not present

## 2021-02-24 DIAGNOSIS — N760 Acute vaginitis: Secondary | ICD-10-CM | POA: Diagnosis not present

## 2021-02-24 DIAGNOSIS — R635 Abnormal weight gain: Secondary | ICD-10-CM

## 2021-02-24 DIAGNOSIS — E282 Polycystic ovarian syndrome: Secondary | ICD-10-CM | POA: Diagnosis not present

## 2021-02-24 DIAGNOSIS — R142 Eructation: Secondary | ICD-10-CM | POA: Diagnosis not present

## 2021-02-24 DIAGNOSIS — E8881 Metabolic syndrome: Secondary | ICD-10-CM | POA: Diagnosis not present

## 2021-02-24 DIAGNOSIS — O24410 Gestational diabetes mellitus in pregnancy, diet controlled: Secondary | ICD-10-CM | POA: Diagnosis not present

## 2021-02-24 MED ORDER — OZEMPIC (2 MG/DOSE) 8 MG/3ML ~~LOC~~ SOPN: 2.0000 mg | PEN_INJECTOR | SUBCUTANEOUS | 1 refills | Status: DC

## 2021-02-24 MED ORDER — METRONIDAZOLE 0.75 % VA GEL: VAGINAL | 3 refills | Status: DC

## 2021-02-24 NOTE — Progress Notes (Signed)
Telemedicine Visit via  Audio only - telephone (patient preference /  technical difficulty with MyChart video application)  I connected with Kelli Hill on 02/24/21 at 11:31 AM  by phone or  telemedicine application as noted above  I verified that I am speaking with or regarding  the correct patient using two identifiers.  Participants: Myself, Dr Emeterio Reeve DO Patient: Kelli Hill Patient proxy if applicable: none Other, if applicable: none  Patient is at home I am in office at [facility]    I discussed the limitations of evaluation and management  by telemedicine and the availability of in person appointments.  The participant(s) above expressed understanding and  agreed to proceed with this appointment via telemedicine.       History of Present Illness: Kelli Hill is a 35 y.o. female who would like to discuss medically managed weight loss   Wt Readings from Last 3 Encounters:  02/24/21 184 lb (83.5 kg)  10/29/20 207 lb 8 oz (94.1 kg)  07/08/20 218 lb (98.9 kg)   Would like to continue Tull for at least a year total  No concerning side effects other than some loose stool in the beginning few months, headache for the first few months controlled by ibuprofen, this resolved after a couple months. Sometimes will occasionally have sulphuric burps which she doesn't find bothersome but is curious about H Pylori testing. SOme issues w/ continue Wegovy though are cost - coupon runs out this month, would like to see if she can get Ozempic covered.   Also she is concerned about recurrent BV. Seems to have concerning vaginal discharge after her periods. Uses pads, does NOT douche, her female partner has been withdrawing consistently.    Observations/Objective: Pulse 91   Wt 184 lb (83.5 kg)   SpO2 100%   BMI 34.77 kg/m  BP Readings from Last 3 Encounters:  10/29/20 129/83  07/08/20 (!) 138/98  06/24/20 110/77   Exam: Normal Speech.  NAD  Lab and Radiology  Results No results found for this or any previous visit (from the past 72 hour(s)). No results found.     Assessment and Plan: 34 y.o. female with The primary encounter diagnosis was Belching. Diagnoses of Bacterial vaginosis, Abnormal weight gain, Polycystic ovaries, and Diet controlled gestational diabetes mellitus (GDM) in third trimester were also pertinent to this visit.  1. Belching H Pylori test pending pt to come to lab   2. Bacterial vaginosis Sent metrogen to use weekly for 3-6 months   3. Abnormal weight gain 4. Polycystic ovaries 5. History of Diet controlled gestational diabetes mellitus (GDM) in third trimester 6. Insulin resistance Trial Ozempic since Mancel Parsons has become cost prohibitive      PDMP not reviewed this encounter. Orders Placed This Encounter  Procedures   H. pylori breath test   Meds ordered this encounter  Medications   Semaglutide, 2 MG/DOSE, (OZEMPIC, 2 MG/DOSE,) 8 MG/3ML SOPN    Sig: Inject 2 mg into the skin once a week.    Dispense:  15 mL    Refill:  1   metroNIDAZOLE (METROGEL) 0.75 % vaginal gel    Sig: 1 applicator ful 1-2 times per week for 3-6 months    Dispense:  70 g    Refill:  3   There are no Patient Instructions on file for this visit.  Instructions sent via MyChart.   Follow Up Instructions: Return in about 6 months (around 08/26/2021) for ANNUAL (call week prior to visit for  lab orders).    I discussed the assessment and treatment plan with the patient. The patient was provided an opportunity to ask questions and all were answered. The patient agreed with the plan and demonstrated an understanding of the instructions.   The patient was advised to call back or seek an in-person evaluation if any new concerns, if symptoms worsen or if the condition fails to improve as anticipated.  21 minutes of non-face-to-face time was provided during this  encounter.      . . . . . . . . . . . . . Marland Kitchen                   Historical information moved to improve visibility of documentation.  No past medical history on file. No past surgical history on file. Social History   Tobacco Use   Smoking status: Former    Pack years: 0.00   Smokeless tobacco: Never  Substance Use Topics   Alcohol use: Never   family history includes Diabetes in her maternal grandfather and maternal grandmother; Endometrial cancer in her mother; Hypertension in her mother; Kidney disease in her mother; Lung cancer in her mother; Ovarian cancer in her mother; Skin cancer in her maternal grandfather.  Medications: Current Outpatient Medications  Medication Sig Dispense Refill   calcium carbonate (TUMS - DOSED IN MG ELEMENTAL CALCIUM) 500 MG chewable tablet Chew 1 tablet by mouth daily.     lidocaine (LIDODERM) 5 % Place 1 patch onto the skin daily. Remove & Discard patch within 12 hours or as directed by MD 15 patch 0   Nystatin POWD Apply liberally to affected area 2 times per day 3 each 11   Semaglutide, 2 MG/DOSE, (OZEMPIC, 2 MG/DOSE,) 8 MG/3ML SOPN Inject 2 mg into the skin once a week. 15 mL 1   famotidine (PEPCID) 40 MG tablet Take 40 mg by mouth at bedtime.     metroNIDAZOLE (METROGEL) 0.75 % vaginal gel 1 applicator ful 1-2 times per week for 3-6 months 70 g 3   No current facility-administered medications for this visit.   Allergies  Allergen Reactions   Iodine Nausea And Vomiting    IV and Oral Contrast Iodine only. NO ISSUES WITH BETADINE   Contrast Media [Iodinated Diagnostic Agents] Nausea And Vomiting     If phone visit, billing and coding can please add appropriate modifier if needed

## 2021-03-03 ENCOUNTER — Encounter: Payer: Self-pay | Admitting: Osteopathic Medicine

## 2021-03-03 DIAGNOSIS — R635 Abnormal weight gain: Secondary | ICD-10-CM

## 2021-03-03 DIAGNOSIS — E282 Polycystic ovarian syndrome: Secondary | ICD-10-CM

## 2021-03-03 DIAGNOSIS — E8881 Metabolic syndrome: Secondary | ICD-10-CM

## 2021-03-06 ENCOUNTER — Telehealth: Payer: Self-pay

## 2021-03-06 MED ORDER — TIRZEPATIDE 5 MG/0.5ML ~~LOC~~ SOAJ: 5.0000 mg | SUBCUTANEOUS | 0 refills | Status: DC

## 2021-03-06 MED ORDER — TIRZEPATIDE 7.5 MG/0.5ML ~~LOC~~ SOAJ: 7.5000 mg | SUBCUTANEOUS | 0 refills | Status: DC

## 2021-03-06 MED ORDER — TIRZEPATIDE 2.5 MG/0.5ML ~~LOC~~ SOAJ: 2.5000 mg | SUBCUTANEOUS | 0 refills | Status: DC

## 2021-03-06 MED ORDER — METFORMIN HCL ER 500 MG PO TB24: 500.0000 mg | ORAL_TABLET | Freq: Every day | ORAL | 11 refills | Status: DC

## 2021-03-06 NOTE — Telephone Encounter (Signed)
Prior authorization for Ozempic submitted to insurance. Pending a determination.

## 2021-03-06 NOTE — Addendum Note (Signed)
Addended by: Maryla Morrow on: 03/06/2021 01:09 PM   Modules accepted: Orders

## 2021-03-12 NOTE — Telephone Encounter (Addendum)
Prior authorization for Ozempic denied by insurance. Per insurance - patient does not meet the criteria for approval on Ozempic.   Here are the policy requirements -  your request did not meet the following:  (1)(A) You have tried or cannot use one metformin containing drug.  (B) You have a disease with high blood sugars (diabetes) and one of the following:  (I) A type of heart disease (atherosclerotic cardiovascular disease or heart failure).  (II) A type of kidney disease (chronic kidney disease).  (2) You have tried or cannot use two preferred drugs: Byetta, Victoza, and Trulicity.

## 2021-06-11 ENCOUNTER — Encounter (INDEPENDENT_AMBULATORY_CARE_PROVIDER_SITE_OTHER): Payer: Medicaid Other | Admitting: Medical-Surgical

## 2021-06-11 DIAGNOSIS — O24410 Gestational diabetes mellitus in pregnancy, diet controlled: Secondary | ICD-10-CM | POA: Diagnosis not present

## 2021-06-12 ENCOUNTER — Other Ambulatory Visit: Payer: Self-pay | Admitting: Sports Medicine

## 2021-06-12 DIAGNOSIS — O24410 Gestational diabetes mellitus in pregnancy, diet controlled: Secondary | ICD-10-CM

## 2021-06-12 MED ORDER — TIRZEPATIDE 10 MG/0.5ML ~~LOC~~ SOAJ: 10.0000 mg | SUBCUTANEOUS | 0 refills | Status: DC

## 2021-06-12 MED ORDER — TIRZEPATIDE 15 MG/0.5ML ~~LOC~~ SOAJ: 15.0000 mg | SUBCUTANEOUS | 0 refills | Status: DC

## 2021-06-12 MED ORDER — TIRZEPATIDE 12.5 MG/0.5ML ~~LOC~~ SOAJ: 12.5000 mg | SUBCUTANEOUS | 0 refills | Status: DC

## 2021-06-12 NOTE — Telephone Encounter (Signed)
I spent 5 total minutes of online digital evaluation and management services. 

## 2021-06-12 NOTE — Addendum Note (Signed)
Addended by: Fonnie Mu on: 06/12/2021 10:53 AM   Modules accepted: Orders

## 2021-06-12 NOTE — Telephone Encounter (Signed)
Looks like the pharmacy is requesting a prior authorization, odd because she has been on mounjaro up titration anyway.

## 2021-07-11 ENCOUNTER — Other Ambulatory Visit: Payer: Self-pay | Admitting: Sports Medicine

## 2021-07-11 ENCOUNTER — Telehealth: Payer: Self-pay

## 2021-07-11 DIAGNOSIS — O24410 Gestational diabetes mellitus in pregnancy, diet controlled: Secondary | ICD-10-CM

## 2021-07-11 NOTE — Telephone Encounter (Signed)
Error

## 2021-07-11 NOTE — Telephone Encounter (Signed)
To covering provider

## 2021-07-11 NOTE — Telephone Encounter (Signed)
Pt needs PA's for the 12.5MG  and the 15MG . Pt was never able to fill the 12.5MG . Thanks

## 2021-07-17 ENCOUNTER — Telehealth: Payer: Self-pay

## 2021-07-17 NOTE — Telephone Encounter (Signed)
Medication: tirzepatide Darcel Bayley) 12.5 MG/0.5ML Pen Prior authorization submitted via CoverMyMeds on 07/17/2021 PA submission pending

## 2021-07-18 NOTE — Telephone Encounter (Signed)
Medication: tirzepatide Darcel Bayley) 12.5 MG/0.5ML Pen Medical records request received from [facility] records faxed on 07/18/2021 Fax confirmation received PA submission pending

## 2021-08-04 ENCOUNTER — Encounter: Payer: Self-pay | Admitting: Medical-Surgical

## 2021-09-22 ENCOUNTER — Other Ambulatory Visit: Payer: Self-pay

## 2021-09-22 ENCOUNTER — Encounter: Payer: Self-pay | Admitting: Family Medicine

## 2021-09-22 ENCOUNTER — Ambulatory Visit: Payer: Medicaid Other | Admitting: Family Medicine

## 2021-09-22 VITALS — BP 128/87 | HR 102 | Temp 98.8°F | Ht 61.0 in | Wt 156.0 lb

## 2021-09-22 DIAGNOSIS — R42 Dizziness and giddiness: Secondary | ICD-10-CM | POA: Diagnosis not present

## 2021-09-22 DIAGNOSIS — N6459 Other signs and symptoms in breast: Secondary | ICD-10-CM

## 2021-09-22 DIAGNOSIS — D509 Iron deficiency anemia, unspecified: Secondary | ICD-10-CM | POA: Diagnosis not present

## 2021-09-22 NOTE — Progress Notes (Signed)
Acute Office Visit  Subjective:    Patient ID: Kelli Hill, female    DOB: October 07, 1985, 36 y.o.   MRN: [id number]  Chief Complaint  Patient presents with   Rash    Right breast   Dizziness    HPI Patient is in today for breast rash and dizziness.   Patient states in July she was taking weight loss pictures and she noticed a rash to the lateral side of her right breast. States it has been intermittent ever since then - will be noticeable for a few days and then gone for awhile before returning. States she has not symptoms and otherwise wouldn't notice it unless she looks for it. Reports the rash is slightly red/purple discoloration, not raised, painful, itchy. States it always comes in the same area. She has felt a lump in this breast several years ago and had negative ultrasounds and negative mammogram 06/2020. She is paranoid that this is inflammatory breast cancer. No family history of breast cancer. She would like imaging again for peace of mind.      Dizziness: Patient reports she has occasionally gotten  dizzy with position changes in the past ever since she had vertigo in 2010, therefore she has always been cautious about changing positions slowly. However, she reports that for the past 2 months or so she has had lightheadedness when changing positions, even if she moves very slowly. Reports she feels slightly presyncopal for about 5-10 seconds before it passes. Only once has she had to sit down because she was afraid she was going to faint. When discussing diet and hydration she reports that her weight loss meds have decreased her appetite - she is skipping breakfast, eating a light lunch (sandwich, lunchable, salad, etc.) and then usually skips dinner. Additionally she reports she typically drinks 1/2 of a can of coke each day and usually little to no other fluids. 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 Periods are regular, moderate to light flow for about 3 days, every 4  weeks - much improved PCOS symptoms since starting mounjaro.   She denies any chest pain, shortness of breath, wheezing, vision changes, recurrent headaches, GI/GU changes, hair/nail changes. Reports her iron has been low in the past.     No past medical history on file.  No past surgical history on file.  Family History  Problem Relation Age of Onset   Endometrial cancer Mother    Lung cancer Mother    Kidney disease Mother        stage 3   Hypertension Mother    Ovarian cancer Mother    Diabetes Maternal Grandmother    Diabetes Maternal Grandfather    Skin cancer Maternal Grandfather     Social History   Socioeconomic History   Marital status: Significant Other    Spouse name: Not on file   Number of children: Not on file   Years of education: Not on file   Highest education level: Not on file  Occupational History   Not on file  Tobacco Use   Smoking status: Former   Smokeless tobacco: Never  Vaping Use   Vaping Use: Never used  Substance and Sexual Activity   Alcohol use: Never   Drug use: Never   Sexual activity: Yes    Partners: Female, Female    Birth control/protection: None  Other Topics Concern   Not on file  Social History Narrative   Not on file   Social Determinants of  Health   Financial Resource Strain: Not on file  Food Insecurity: Not on file  Transportation Needs: Not on file  Physical Activity: Not on file  Stress: Not on file  Social Connections: Not on file  Intimate Partner Violence: Not on file    Outpatient Medications Prior to Visit  Medication Sig Dispense Refill   calcium carbonate (TUMS - DOSED IN MG ELEMENTAL CALCIUM) 500 MG chewable tablet Chew 1 tablet by mouth daily.     famotidine (PEPCID) 40 MG tablet Take 40 mg by mouth at bedtime.     lidocaine (LIDODERM) 5 % Place 1 patch onto the skin daily. Remove & Discard patch within 12 hours or as directed by MD 15 patch 0   metFORMIN (GLUCOPHAGE XR) 500 MG 24 hr tablet Take 1  tablet (500 mg total) by mouth daily with breakfast. 30 tablet 11   metroNIDAZOLE (METROGEL) 0.75 % vaginal gel 1 applicator ful 1-2 times per week for 3-6 months 70 g 3   Nystatin POWD Apply liberally to affected area 2 times per day 3 each 11   tirzepatide (MOUNJARO) 10 MG/0.5ML Pen Inject 10 mg into the skin once a week. 2 mL 0   tirzepatide (MOUNJARO) 12.5 MG/0.5ML Pen Inject 12.5 mg into the skin once a week. 2 mL 0   tirzepatide (MOUNJARO) 15 MG/0.5ML Pen Inject 15 mg into the skin once a week. 6 mL 0   No facility-administered medications prior to visit.    Allergies  Allergen Reactions   Iodine Nausea And Vomiting    IV and Oral Contrast Iodine only. NO ISSUES WITH BETADINE   Contrast Media [Iodinated Contrast Media] Nausea And Vomiting    Review of Systems All review of systems negative except what is listed in the HPI     Objective:    Physical Exam Vitals reviewed.  Constitutional:      Appearance: Normal appearance.  HENT:     Head: Normocephalic and atraumatic.  Cardiovascular:     Rate and Rhythm: Normal rate and regular rhythm.     Heart sounds: Normal heart sounds.  Pulmonary:     Effort: Pulmonary effort is normal.     Breath sounds: Normal breath sounds.  Skin:    General: Skin is warm and dry.     Capillary Refill: Capillary refill takes less than 2 seconds.     Comments: Faint erythema to right lateral lower breast with capillaries visible, no inflammation, dimpling, masses, lesions, discharge  Neurological:     General: No focal deficit present.     Mental Status: She is alert and oriented to person, place, and time. Mental status is at baseline.  Psychiatric:        Mood and Affect: Mood normal.        Behavior: Behavior normal.        Thought Content: Thought content normal.        Judgment: Judgment normal.    BP 128/87 (BP Location: Left Arm, Patient Position: Sitting, Cuff Size: Normal)    Pulse (!) 102    Temp 98.8 F (37.1 C) (Oral)    Ht  5\' 1"  (1.549 m)    Wt 156 lb 0.6 oz (70.8 kg)    SpO2 99%    BMI 29.48 kg/m  Wt Readings from Last 3 Encounters:  09/22/21 156 lb 0.6 oz (70.8 kg)  02/24/21 184 lb (83.5 kg)  10/29/20 207 lb 8 oz (94.1 kg)    Health Maintenance Due  Topic Date Due   URINE MICROALBUMIN  Never done   HIV Screening  Never done   Hepatitis C Screening  Never done   PAP SMEAR-Modifier  Never done    There are no preventive care reminders to display for this patient.   Lab Results  Component Value Date   TSH 2.43 05/29/2020   Lab Results  Component Value Date   WBC 5.7 05/29/2020   HGB 10.6 (L) 05/29/2020   HCT 33.7 (L) 05/29/2020   MCV 76.1 (L) 05/29/2020   PLT 371 05/29/2020   Lab Results  Component Value Date   NA 137 05/29/2020   K 4.4 05/29/2020   CO2 26 05/29/2020   GLUCOSE 97 05/29/2020   BUN 15 05/29/2020   CREATININE 1.19 (H) 05/29/2020   BILITOT 0.4 05/29/2020   AST 14 05/29/2020   ALT 13 05/29/2020   PROT 6.5 05/29/2020   CALCIUM 9.6 05/29/2020   Lab Results  Component Value Date   CHOL 248 (H) 05/29/2020   Lab Results  Component Value Date   HDL 63 05/29/2020   Lab Results  Component Value Date   LDLCALC 158 (H) 05/29/2020   Lab Results  Component Value Date   TRIG 142 05/29/2020   Lab Results  Component Value Date   CHOLHDL 3.9 05/29/2020   Lab Results  Component Value Date   HGBA1C 5.3 05/29/2020       Assessment & Plan:   1. Breast complaint Rash not very obvious today, but capillaries are very visible. Will go ahead and get [place] for peace of mind. Patient aware of signs/symptoms requiring further/urgent evaluation.  - US BREAST COMPLETE UNI RIGHT INC AXILLA; Future  2. Lightheadedness Likely related to inadequate fluid/food intake. Checking labs to make sure  no underlying causes. Thorough discussion of adequate hydration and food intake. Patient aware of signs/symptoms requiring further/urgent evaluation.  - CBC with Differential/Platelet -  Iron, TIBC and Ferritin Panel - Comprehensive metabolic panel - TSH   Follow-up pending results or as needed.   Terrilyn Saver, NP

## 2021-09-23 LAB — COMPREHENSIVE METABOLIC PANEL
AG Ratio: 2 (calc) (ref 1.0–2.5)
ALT: 10 U/L (ref 6–29)
AST: 12 U/L (ref 10–30)
Albumin: 4.6 g/dL (ref 3.6–5.1)
Alkaline phosphatase (APISO): 64 U/L (ref 31–125)
BUN/Creatinine Ratio: 11 (calc) (ref 6–22)
BUN: 12 mg/dL (ref 7–25)
CO2: 23 mmol/L (ref 20–32)
Calcium: 10.1 mg/dL (ref 8.6–10.2)
Chloride: 106 mmol/L (ref 98–110)
Creat: 1.08 mg/dL — ABNORMAL HIGH (ref 0.50–0.97)
Globulin: 2.3 g/dL (calc) (ref 1.9–3.7)
Glucose, Bld: 90 mg/dL (ref 65–99)
Potassium: 4 mmol/L (ref 3.5–5.3)
Sodium: 140 mmol/L (ref 135–146)
Total Bilirubin: 0.4 mg/dL (ref 0.2–1.2)
Total Protein: 6.9 g/dL (ref 6.1–8.1)

## 2021-09-23 LAB — CBC WITH DIFFERENTIAL/PLATELET
Absolute Monocytes: 485 cells/uL (ref 200–950)
Basophils Absolute: 50 cells/uL (ref 0–200)
Basophils Relative: 0.8 %
Eosinophils Absolute: 170 cells/uL (ref 15–500)
Eosinophils Relative: 2.7 %
HCT: 35.1 % (ref 35.0–45.0)
Hemoglobin: 11.1 g/dL — ABNORMAL LOW (ref 11.7–15.5)
Lymphs Abs: 1770 cells/uL (ref 850–3900)
MCH: 23.8 pg — ABNORMAL LOW (ref 27.0–33.0)
MCHC: 31.6 g/dL — ABNORMAL LOW (ref 32.0–36.0)
MCV: 75.2 fL — ABNORMAL LOW (ref 80.0–100.0)
MPV: 10.1 fL (ref 7.5–12.5)
Monocytes Relative: 7.7 %
Neutro Abs: 3824 cells/uL (ref 1500–7800)
Neutrophils Relative %: 60.7 %
Platelets: 375 10*3/uL (ref 140–400)
RBC: 4.67 10*6/uL (ref 3.80–5.10)
RDW: 14.5 % (ref 11.0–15.0)
Total Lymphocyte: 28.1 %
WBC: 6.3 10*3/uL (ref 3.8–10.8)

## 2021-09-23 LAB — IRON,TIBC AND FERRITIN PANEL
%SAT: 6 % (calc) — ABNORMAL LOW (ref 16–45)
Ferritin: 4 ng/mL — ABNORMAL LOW (ref 16–154)
Iron: 23 ug/dL — ABNORMAL LOW (ref 40–190)
TIBC: 418 mcg/dL (calc) (ref 250–450)

## 2021-09-23 LAB — TSH: TSH: 1.81 mIU/L

## 2021-09-24 ENCOUNTER — Encounter: Payer: Self-pay | Admitting: Medical-Surgical

## 2021-09-24 NOTE — Telephone Encounter (Signed)
She will need an appointment especially if changing medications again.

## 2021-09-30 ENCOUNTER — Telehealth (INDEPENDENT_AMBULATORY_CARE_PROVIDER_SITE_OTHER): Payer: Medicaid Other | Admitting: Sports Medicine

## 2021-09-30 DIAGNOSIS — O24410 Gestational diabetes mellitus in pregnancy, diet controlled: Secondary | ICD-10-CM

## 2021-09-30 DIAGNOSIS — Z3A00 Weeks of gestation of pregnancy not specified: Secondary | ICD-10-CM

## 2021-09-30 MED ORDER — OZEMPIC (1 MG/DOSE) 2 MG/1.5ML ~~LOC~~ SOPN: 1.0000 mg | PEN_INJECTOR | SUBCUTANEOUS | 11 refills | Status: DC

## 2021-09-30 NOTE — Progress Notes (Signed)
Alexa would like a video visit to discuss cheaper weight loss options outside of Mounjaro. The cost is $800 after a savings card. She likes the way it works but has taken her last injection.  Her weight this morning was 158 lbs

## 2021-09-30 NOTE — Assessment & Plan Note (Signed)
This is a pleasant 36 year old female, she has had excellent weight loss with Wegovy and then Mounjaro, 60 pounds. Unfortunately Kelli Hill is no longer be covered. She does have metabolic syndrome, hyperglycemia, history of gestational diabetes mellitus, obesity. For this reason I think she is certainly a candidate for continued GLP-1 treatment, Mancel Parsons recently denied by her Medicaid, since [facility] denied as well we will try to get Ozempic covered, starting in middle range of her She will also call her insurance plan and see if a different plan will provide coverage for this medication.

## 2021-09-30 NOTE — Progress Notes (Signed)
° °  Virtual Visit via WebEx/MyChart   I connected with  Kelli Hill  on 09/30/21 via WebEx/MyChart/Doximity Video and verified that I am speaking with the correct person using two identifiers.   I discussed the limitations, risks, security and privacy concerns of performing an evaluation and management service by WebEx/MyChart/Doximity Video, including the higher likelihood of inaccurate diagnosis and treatment, and the availability of in person appointments.  We also discussed the likely need of an additional face to face encounter for complete and high quality delivery of care.  I also discussed with the patient that there may be a patient responsible charge related to this service. The patient expressed understanding and wishes to proceed.  Provider location is in medical facility. Patient location is at their home, different from provider location. People involved in care of the patient during this telehealth encounter were myself, my nurse/medical assistant, and my front office/scheduling team member.  Review of Systems: No fevers, chills, night sweats, weight loss, chest pain, or shortness of breath.   Objective Findings:    General: Speaking full sentences, no audible heavy breathing.  Sounds alert and appropriately interactive.  Appears well.  Face symmetric.  Extraocular movements intact.  Pupils equal and round.  No nasal flaring or accessory muscle use visualized.  Independent interpretation of tests performed by another provider:   None.  Brief History, Exam, Impression, and Recommendations:    Diet controlled gestational diabetes mellitus (GDM) in third trimester This is a pleasant 36 year old female, she has had excellent weight loss with Wegovy and then Mounjaro, 60 pounds. Unfortunately Darcel Bayley is no longer be covered. She does have metabolic syndrome, hyperglycemia, history of gestational diabetes mellitus, obesity. For this reason I think she is certainly a candidate for  continued GLP-1 treatment, Mancel Parsons recently denied by her Medicaid, since [facility] denied as well we will try to get Ozempic covered, starting in middle range of her She will also call her insurance plan and see if a different plan will provide coverage for this medication.  I discussed the above assessment and treatment plan with the patient. The patient was provided an opportunity to ask questions and all were answered. The patient agreed with the plan and demonstrated an understanding of the instructions.   The patient was advised to call back or seek an in-person evaluation if the symptoms worsen or if the condition fails to improve as anticipated.   I provided 30 minutes of face to face and non-face-to-face time during this encounter date, time was needed to gather information, review chart, records, communicate/coordinate with staff remotely, as well as complete documentation.   ___________________________________________ [name]. [name], M.D., ABFM., CAQSM. Primary Care and [employer] Instructor of [employer] [facility] [employer]

## 2021-10-01 ENCOUNTER — Encounter: Payer: Self-pay | Admitting: Sports Medicine

## 2021-10-03 ENCOUNTER — Other Ambulatory Visit: Payer: Self-pay | Admitting: Family Medicine

## 2021-10-03 DIAGNOSIS — N6459 Other signs and symptoms in breast: Secondary | ICD-10-CM

## 2021-10-21 ENCOUNTER — Ambulatory Visit: Payer: Medicaid Other | Admitting: Medical-Surgical

## 2021-12-03 DIAGNOSIS — Z124 Encounter for screening for malignant neoplasm of cervix: Secondary | ICD-10-CM | POA: Diagnosis not present

## 2021-12-08 ENCOUNTER — Ambulatory Visit: Payer: Medicaid Other

## 2022-01-12 DIAGNOSIS — Z419 Encounter for procedure for purposes other than remedying health state, unspecified: Secondary | ICD-10-CM | POA: Diagnosis not present

## 2022-01-22 ENCOUNTER — Telehealth: Payer: Self-pay

## 2022-01-22 NOTE — Telephone Encounter (Signed)
Initiated Prior authorization for: Ozempic (1 MG/DOSE) '4MG'$ /3ML pen-injectors ?Via: Covermymeds ?Case/Key:[id number] ?Status: approved  as of 01/21/22 ?Reason:Approved quantity: 3 units per 28 day(s). The drug has been approved from 01/07/2022 to 01/21/2023 ?Notified Pt via: Mychart  ?

## 2022-02-03 DIAGNOSIS — R338 Other retention of urine: Secondary | ICD-10-CM | POA: Diagnosis not present

## 2022-02-03 DIAGNOSIS — M629 Disorder of muscle, unspecified: Secondary | ICD-10-CM | POA: Diagnosis not present

## 2022-02-03 DIAGNOSIS — N760 Acute vaginitis: Secondary | ICD-10-CM | POA: Diagnosis not present

## 2022-02-04 ENCOUNTER — Encounter (INDEPENDENT_AMBULATORY_CARE_PROVIDER_SITE_OTHER): Payer: Medicaid Other | Admitting: Sports Medicine

## 2022-02-04 DIAGNOSIS — O24410 Gestational diabetes mellitus in pregnancy, diet controlled: Secondary | ICD-10-CM

## 2022-02-04 MED ORDER — SEMAGLUTIDE (2 MG/DOSE) 8 MG/3ML ~~LOC~~ SOPN: 2.0000 mg | PEN_INJECTOR | SUBCUTANEOUS | 11 refills | Status: DC

## 2022-02-04 NOTE — Telephone Encounter (Signed)
I spent 5 total minutes of online digital evaluation and management services in this patient-initiated request for online care. 

## 2022-02-12 DIAGNOSIS — Z419 Encounter for procedure for purposes other than remedying health state, unspecified: Secondary | ICD-10-CM | POA: Diagnosis not present

## 2022-03-14 DIAGNOSIS — Z419 Encounter for procedure for purposes other than remedying health state, unspecified: Secondary | ICD-10-CM | POA: Diagnosis not present

## 2022-03-25 ENCOUNTER — Telehealth (INDEPENDENT_AMBULATORY_CARE_PROVIDER_SITE_OTHER): Payer: Medicaid Other | Admitting: Sports Medicine

## 2022-03-25 DIAGNOSIS — F422 Mixed obsessional thoughts and acts: Secondary | ICD-10-CM | POA: Diagnosis not present

## 2022-03-25 DIAGNOSIS — F429 Obsessive-compulsive disorder, unspecified: Secondary | ICD-10-CM | POA: Insufficient documentation

## 2022-03-25 NOTE — Assessment & Plan Note (Signed)
This is a pleasant 35 year old female, she currently has 109-year-old twins, she is not working, she does have a history of anxiety and depression. She has noted increasing symptoms of obsessions and compulsions such as thinking her family will die if she does not wear a particular short during the day, she is profoundly derma phobic, she avoids going to the swimming pool due to fear of germs. She has significant anxiety, panic attacks through the day and feels overall nonfunctional in society. No suicidal or homicidal ideation. She has worked with behavioral health in the past who suggested OCD was a diagnosis. She is interested in seeking treatment, she would like to start with behavioral therapy, I did explain that pharmacotherapy and behavioral therapy combined was most effective, but she is profoundly afraid of medication. She is however taking Ozempic to lose weight. I am happy to set her up with behavioral therapy, though I did explain the importance of not ruling out a modality that might be profoundly beneficial to her such as fluoxetine and paroxetine. We will do 6 weeks of behavioral therapy followed by medication if insufficient improvement, she does need to find a medical PCP here.

## 2022-03-25 NOTE — Progress Notes (Signed)
   Virtual Visit via WebEx/MyChart   I connected with  Kelli Hill  on 03/25/22 via WebEx/MyChart/Doximity Video and verified that I am speaking with the correct person using two identifiers.   I discussed the limitations, risks, security and privacy concerns of performing an evaluation and management service by WebEx/MyChart/Doximity Video, including the higher likelihood of inaccurate diagnosis and treatment, and the availability of in person appointments.  We also discussed the likely need of an additional face to face encounter for complete and high quality delivery of care.  I also discussed with the patient that there may be a patient responsible charge related to this service. The patient expressed understanding and wishes to proceed.  Provider location is in medical facility. Patient location is at their home, different from provider location. People involved in care of the patient during this telehealth encounter were myself, my nurse/medical assistant, and my front office/scheduling team member.  Review of Systems: No fevers, chills, night sweats, weight loss, chest pain, or shortness of breath.   Objective Findings:    General: Speaking full sentences, no audible heavy breathing.  Sounds alert and appropriately interactive.  Appears well.  Face symmetric.  Extraocular movements intact.  Pupils equal and round.  No nasal flaring or accessory muscle use visualized.  Independent interpretation of tests performed by another provider:   None.  Brief History, Exam, Impression, and Recommendations:    Obsessive compulsive disorder This is a pleasant 36 year old female, she currently has 42-year-old twins, she is not working, she does have a history of anxiety and depression. She has noted increasing symptoms of obsessions and compulsions such as thinking her family will die if she does not wear a particular short during the day, she is profoundly derma phobic, she avoids going to the  swimming pool due to fear of germs. She has significant anxiety, panic attacks through the day and feels overall nonfunctional in society. No suicidal or homicidal ideation. She has worked with behavioral health in the past who suggested OCD was a diagnosis. She is interested in seeking treatment, she would like to start with behavioral therapy, I did explain that pharmacotherapy and behavioral therapy combined was most effective, but she is profoundly afraid of medication. She is however taking Ozempic to lose weight. I am happy to set her up with behavioral therapy, though I did explain the importance of not ruling out a modality that might be profoundly beneficial to her such as fluoxetine and paroxetine. We will do 6 weeks of behavioral therapy followed by medication if insufficient improvement, she does need to find a medical PCP here.   I discussed the above assessment and treatment plan with the patient. The patient was provided an opportunity to ask questions and all were answered. The patient agreed with the plan and demonstrated an understanding of the instructions.   The patient was advised to call back or seek an in-person evaluation if the symptoms worsen or if the condition fails to improve as anticipated.   I provided 30 minutes of face to face and non-face-to-face time during this encounter date, time was needed to gather information, review chart, records, communicate/coordinate with staff remotely, as well as complete documentation.   ____________________________________________ [name]. [name], M.D., ABFM., CAQSM., AME. Primary Care and Sports Medicine [facility]  Adjunct Professor of [employer] [facility] [employer]  Risk manager

## 2022-04-14 ENCOUNTER — Encounter: Payer: Self-pay | Admitting: Sports Medicine

## 2022-04-14 DIAGNOSIS — Z419 Encounter for procedure for purposes other than remedying health state, unspecified: Secondary | ICD-10-CM | POA: Diagnosis not present

## 2022-04-28 IMAGING — MG DIGITAL DIAGNOSTIC BILAT W/ TOMO W/ CAD
8 series · 8 of 24 positions shown · non-contrast
Comparison: None.

CLINICAL DATA: Patient reports bilateral diffuse breast pain.
History of PCOS.

EXAM:
DIGITAL DIAGNOSTIC BILATERAL MAMMOGRAM WITH TOMO AND CAD

[R MLO synth-2D]
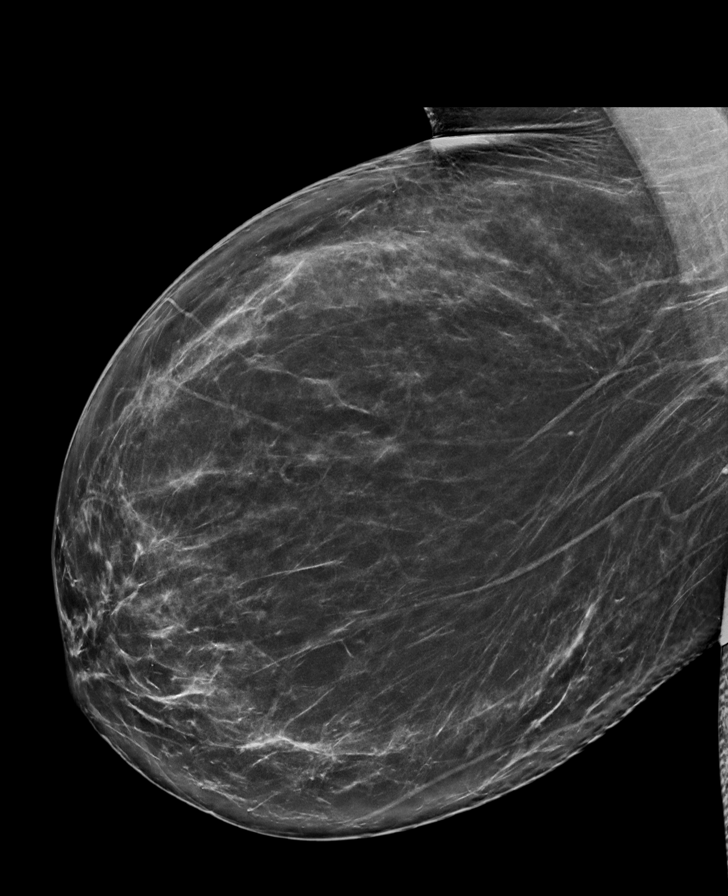

[L CC synth-2D]
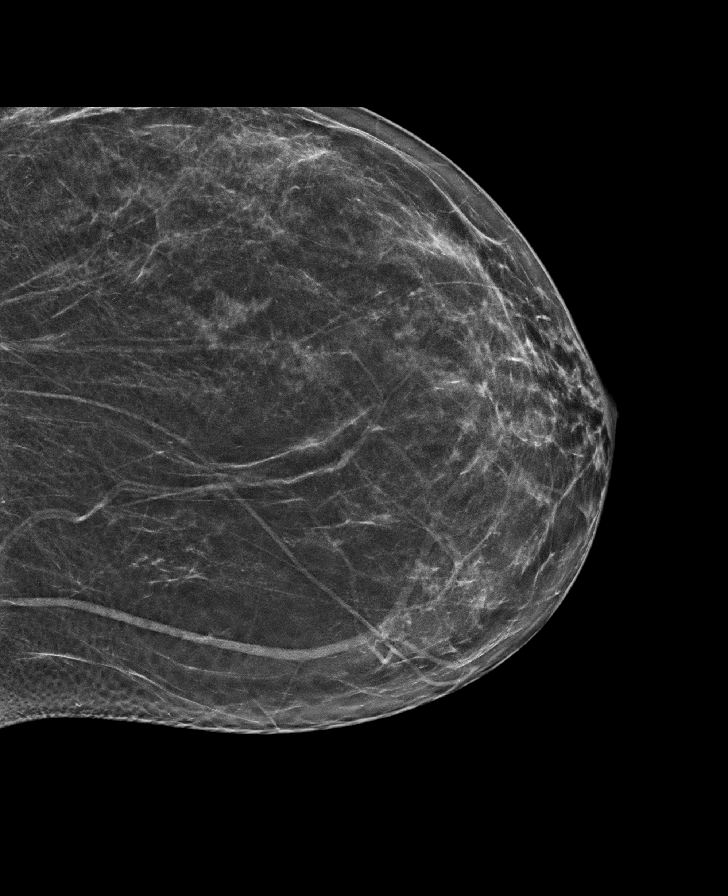

[L MLO synth-2D]
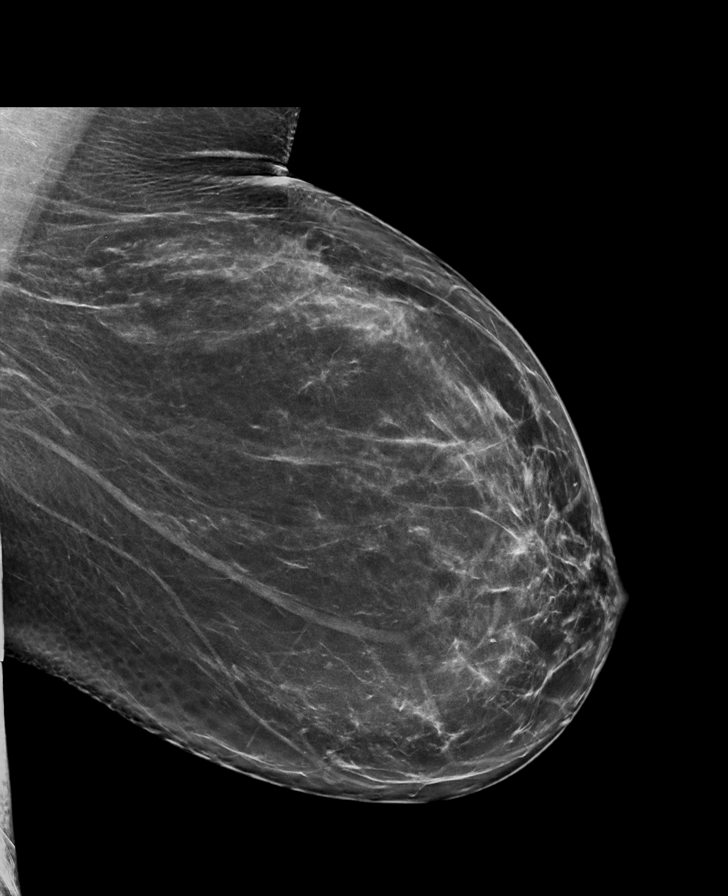

[R CC synth-2D]
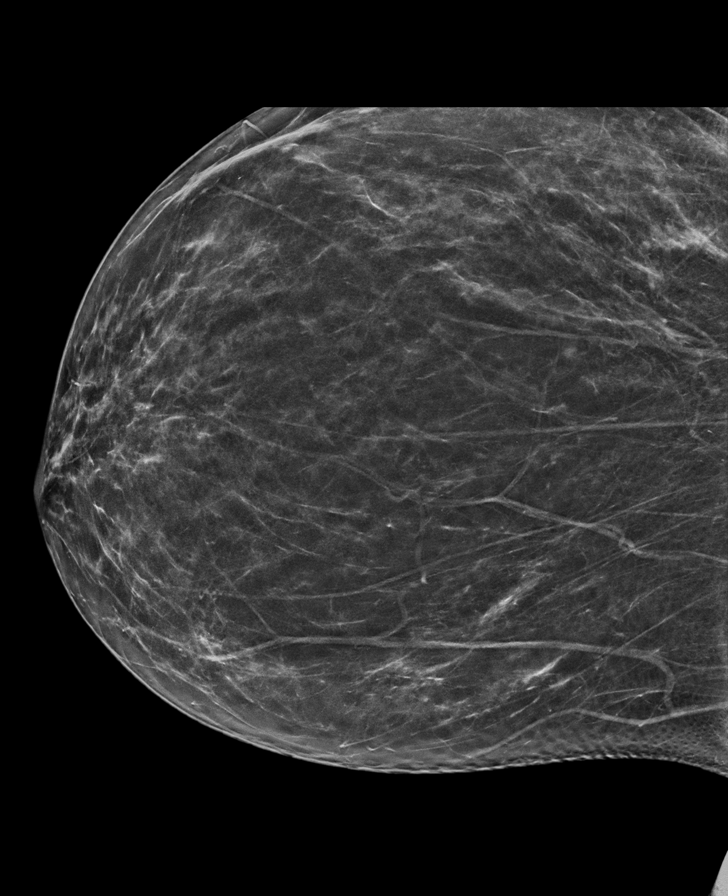

[R MLO tomo · tomo slice 41/82.0]
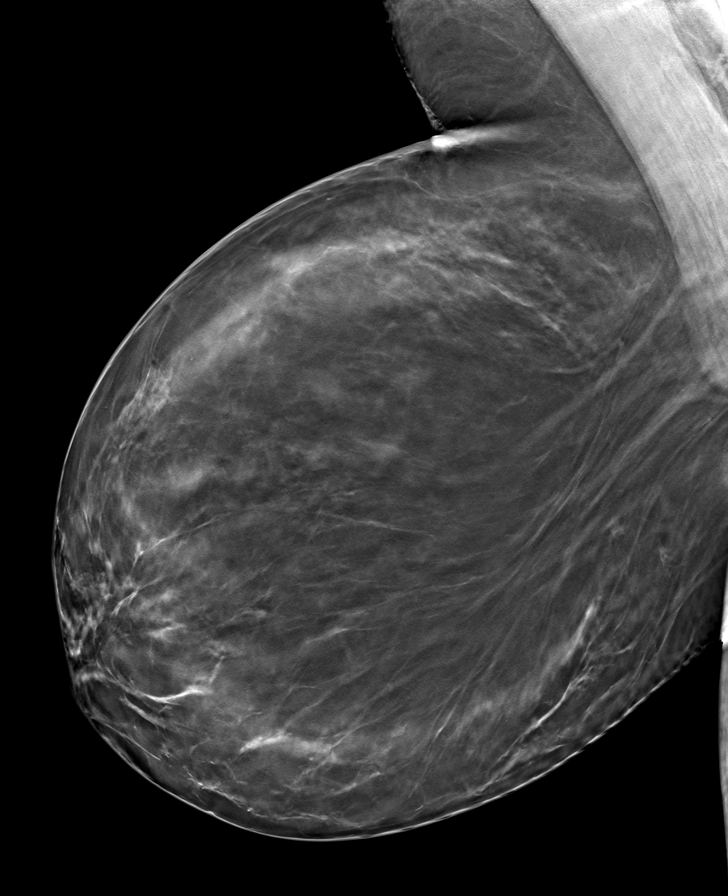

[L CC tomo · tomo slice 35/70.0]
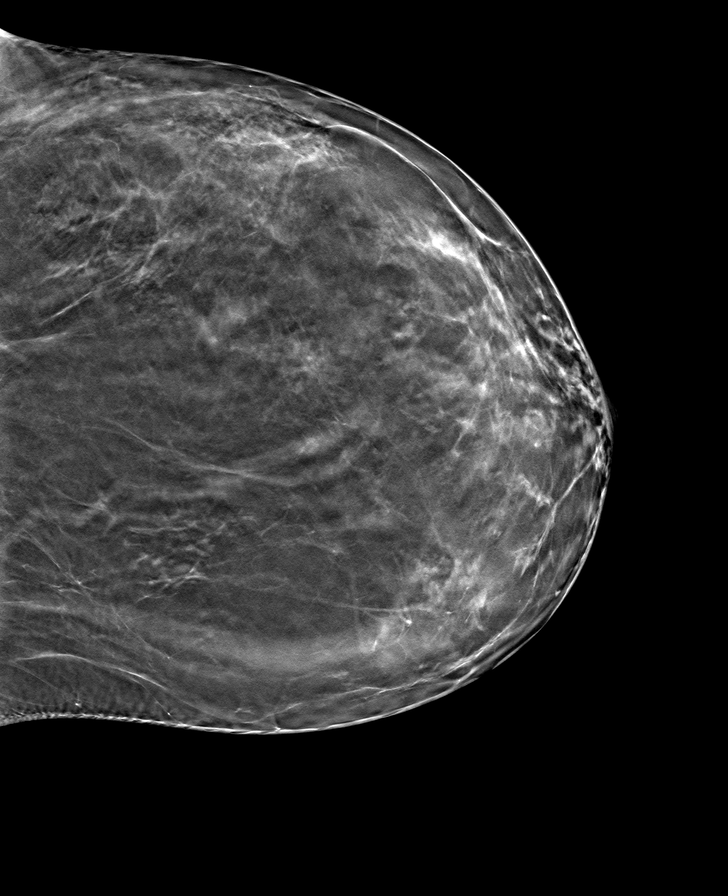

[L MLO tomo · tomo slice 44/87.0]
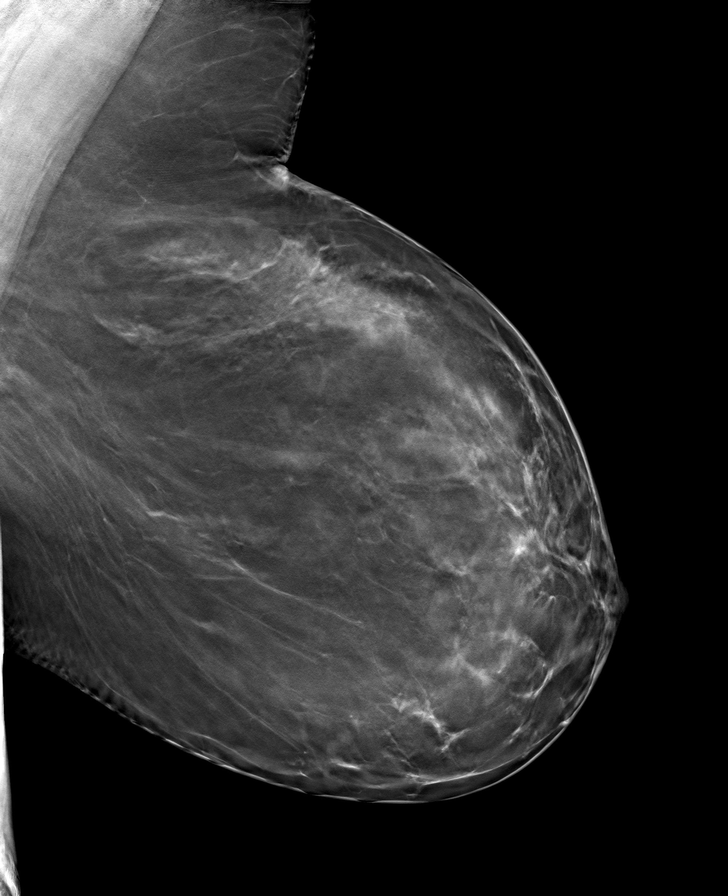

[R CC tomo · tomo slice 35/68.0]
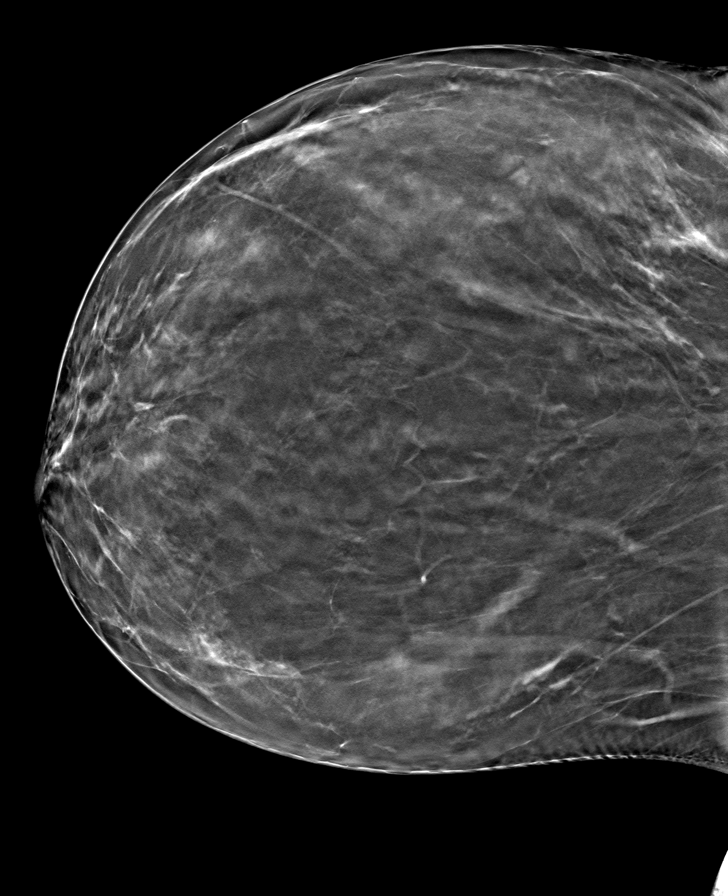

[8 of 24 positions shown; findings below may reference images not displayed]

ACR Breast Density Category b: There are scattered areas of
fibroglandular density.
FINDINGS: No suspicious mass, distortion, or microcalcifications are
identified to suggest presence of malignancy.

Mammographic images were processed with CAD.
IMPRESSION: No mammographic evidence for malignancy.

Breast pain is a common condition. It can be exacerbated by
caffeine, hormonal changes, and weight gain. It can be improved by
wearing adequate support, over-the-counter pain medication, low-fat
diet, exercise, and ice as needed. Often breast pain will resolve on
its own without intervention.

RECOMMENDATION:
Screening mammogram at age 40 unless there are persistent or
intervening clinical concerns. (Code:KH-[id number])

I have discussed the findings and recommendations with the patient.
If applicable, a reminder letter will be sent to the patient
regarding the next appointment.

BI-RADS CATEGORY  1: Negative.

## 2022-05-15 DIAGNOSIS — Z419 Encounter for procedure for purposes other than remedying health state, unspecified: Secondary | ICD-10-CM | POA: Diagnosis not present

## 2022-06-14 DIAGNOSIS — Z419 Encounter for procedure for purposes other than remedying health state, unspecified: Secondary | ICD-10-CM | POA: Diagnosis not present

## 2022-06-26 ENCOUNTER — Ambulatory Visit: Payer: Medicaid Other

## 2022-07-07 DIAGNOSIS — R102 Pelvic and perineal pain: Secondary | ICD-10-CM | POA: Diagnosis not present

## 2022-07-07 DIAGNOSIS — N390 Urinary tract infection, site not specified: Secondary | ICD-10-CM | POA: Diagnosis not present

## 2022-07-15 DIAGNOSIS — Z419 Encounter for procedure for purposes other than remedying health state, unspecified: Secondary | ICD-10-CM | POA: Diagnosis not present

## 2022-07-27 ENCOUNTER — Ambulatory Visit
Admission: RE | Admit: 2022-07-27 | Discharge: 2022-07-27 | Disposition: A | Discharge status: Home / Self Care | Payer: Medicaid Other | Source: Ambulatory Visit | Attending: Family Medicine | Admitting: Family Medicine

## 2022-07-27 VITALS — BP 130/89 | HR 80 | Temp 98.4°F | Resp 12 | Wt 142.0 lb

## 2022-07-27 DIAGNOSIS — R059 Cough, unspecified: Secondary | ICD-10-CM

## 2022-07-27 MED ORDER — DOXYCYCLINE HYCLATE 100 MG PO CAPS: 100.0000 mg | ORAL_CAPSULE | Freq: Two times a day (BID) | ORAL | 0 refills | Status: AC

## 2022-07-27 MED ORDER — BENZONATATE 200 MG PO CAPS: 200.0000 mg | ORAL_CAPSULE | Freq: Three times a day (TID) | ORAL | 0 refills | Status: AC | PRN

## 2022-07-27 NOTE — ED Provider Notes (Signed)
[facility]    CSN: [id number] Arrival date & time: 07/27/22  1849      History   Chief Complaint Chief Complaint  Patient presents with   Cough    Possible flu/ rsv - Entered by patient    HPI Kelli Hill is a 36 y.o. female.   HPI 36 year old female presents with cough for 3 weeks.  PMH significant for OCD, IBS, and sleep apnea.  History reviewed. No pertinent past medical history.  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   Breast pain, left 06/24/2020   Bruxism 10/31/2019   Referred ear pain, bilateral 10/31/2019   Cesarean delivery delivered 02/09/2018   Pregnancy 02/06/2018   Preterm uterine contractions in third trimester, antepartum 01/18/2018   [redacted] weeks gestation of pregnancy 12/20/2017   Persistent proteinuria 12/20/2017   Diet controlled gestational diabetes mellitus (GDM) in third trimester 12/13/2017   Gastroesophageal reflux disease 05/03/2017   Postnasal drip 01/25/2017   Chronic maxillary sinusitis 09/24/2016   Absence of menstruation 02/18/2013   Anxiety state 02/18/2013   Breast mass 02/18/2013   Vaginitis and vulvovaginitis 02/18/2013   Depressive disorder 02/18/2013   Excessive and frequent menstruation 02/18/2013   Screening examination for sexually transmitted disease 02/18/2013   Surveillance for birth control, intrauterine device 02/18/2013   Negative pregnancy test 10/06/2012     History reviewed. No pertinent surgical history.  OB History   No obstetric history on file.      Home Medications    Prior to Admission medications   Medication Sig Start Date End Date Taking? Authorizing Provider  benzonatate (TESSALON) 200 MG capsule Take 1 capsule (200 mg total) by mouth 3 (three) times daily as needed for up to 7 days. 07/27/22 08/03/22 Yes Eliezer Lofts, FNP  doxycycline (VIBRAMYCIN) 100 MG capsule Take 1 capsule (100 mg total) by mouth 2 (two) times daily for 7 days. 07/27/22 08/03/22 Yes Eliezer Lofts, FNP  calcium carbonate (TUMS - DOSED IN MG ELEMENTAL CALCIUM) 500 MG chewable tablet Chew 1 tablet by mouth daily. Patient not taking: Reported on 07/27/2022    [provider]  famotidine (PEPCID) 40 MG tablet Take 40 mg by mouth at bedtime. Patient not taking: Reported on 07/27/2022 02/04/21   [provider]  lidocaine (LIDODERM) 5 % Place 1 patch onto the skin daily. Remove & Discard patch within 12 hours or as directed by MD Patient not taking: Reported on 07/27/2022 10/29/20   Samuel Bouche, NP  metFORMIN (GLUCOPHAGE XR) 500 MG 24 hr tablet Take 1 tablet (500 mg total) by mouth daily with breakfast. Patient not taking: Reported on 07/27/2022 03/06/21 03/06/22  Emeterio Reeve, DO  metroNIDAZOLE (METROGEL) 0.75 % vaginal gel 1 applicator ful 1-2 times per week for 3-6 months Patient not taking: Reported on 07/27/2022 02/24/21   Emeterio Reeve, DO  Nystatin POWD Apply liberally to affected area 2 times per day Patient not taking: Reported on 07/27/2022 10/15/20   Emeterio Reeve, DO  Semaglutide, 2 MG/DOSE, 8 MG/3ML SOPN Inject 2 mg as directed once a week. 02/04/22   Silverio Decamp, MD    Family History Family History  Problem Relation Age of Onset   Endometrial cancer Mother    Lung cancer Mother    Kidney disease Mother        stage 3   Hypertension Mother    Ovarian cancer Mother    Diabetes Maternal Grandmother     Diabetes Maternal Grandfather    Skin cancer Maternal Grandfather     Social History Social History   Tobacco Use   Smoking status: Former   Smokeless tobacco: Never  Scientific laboratory technician Use: Never used  Substance Use Topics   Alcohol use: Never   Drug use: Never     Allergies   Iodine and Contrast media [iodinated contrast media]   Review of Systems Review of Systems  Respiratory:  Positive for cough.   All other systems reviewed and are negative.    Physical Exam Triage Vital Signs ED Triage Vitals  Enc Vitals Group     BP 07/27/22 1924 130/89     Pulse Rate 07/27/22 1924 80     Resp 07/27/22 1924 12     Temp 07/27/22 1924 98.4 F (36.9 C)     Temp Source 07/27/22 1924 Oral     SpO2 07/27/22 1924 99 %     Weight 07/27/22 1925 142 lb (64.4 kg)     Height --      Head Circumference --      Peak Flow --      Pain Score 07/27/22 1925 0     Pain Loc --      Pain Edu? --      Excl. in [place]? --    No data found.  Updated Vital Signs BP 130/89 (BP Location: Left Arm)   Pulse 80   Temp 98.4 F (36.9 C) (Oral)   Resp 12   Wt 142 lb (64.4 kg)   SpO2 99%   BMI 26.83 kg/m      Physical Exam Vitals and nursing note reviewed.  Constitutional:      Appearance: Normal appearance. She is normal weight.  HENT:     Head: Normocephalic and atraumatic.     Right Ear: Tympanic membrane, ear canal and external ear normal.     Left Ear: Tympanic membrane, ear canal and external ear normal.     Nose: Nose normal.     Mouth/Throat:     Mouth: Mucous membranes are moist.     Pharynx: Oropharynx is clear.  Eyes:     Extraocular Movements: Extraocular movements intact.     Conjunctiva/sclera: Conjunctivae normal.     Pupils: Pupils are equal, round, and reactive to light.  Cardiovascular:     Rate and Rhythm: Normal rate and regular rhythm.     Pulses: Normal pulses.     Heart sounds: Normal heart sounds.  Pulmonary:     Effort: Pulmonary effort is normal.      Breath sounds: Normal breath sounds. No wheezing, rhonchi or rales.     Comments: Frequent nonproductive cough noted on exam Musculoskeletal:        General: Normal range of motion.     Cervical back: Normal range of motion and neck supple.  Skin:    General: Skin is warm.  Neurological:     General: No focal deficit present.     Mental Status: She  is alert and oriented to person, place, and time.      UC Treatments / Results  Labs (all labs ordered are listed, but only abnormal results are displayed) Labs Reviewed - No data to display  EKG   Radiology No results found.  Procedures Procedures (including critical care time)  Medications Ordered in UC Medications - No data to display  Initial Impression / Assessment and Plan / UC Course  I have reviewed the triage vital signs and the nursing notes.  Pertinent labs & imaging results that were available during my care of the patient were reviewed by me and considered in my medical decision making (see chart for details).     MDM: 1. Cough-Rx'd Doxycycline, Tessalon. Instructed patient to take medication as directed with food to completion.  Advised may take Tessalon daily or as needed for cough.  Encouraged increase daily water intake while taking these medications.  Advised if symptoms worsen and/or unresolved please follow-up with PCP or here for further evaluation.  Patient discharged home, hemodynamically stable. Final Clinical Impressions(s) / UC Diagnoses   Final diagnoses:  Cough, unspecified type     Discharge Instructions      Instructed patient to take medication as directed with food to completion.  Advised may take Tessalon daily or as needed for cough.  Encouraged increase daily water intake while taking these medications.  Advised if symptoms worsen and/or unresolved please follow-up with PCP or here for further evaluation.     ED Prescriptions     Medication Sig Dispense Auth. Provider    doxycycline (VIBRAMYCIN) 100 MG capsule Take 1 capsule (100 mg total) by mouth 2 (two) times daily for 7 days. 14 capsule Eliezer Lofts, FNP   benzonatate (TESSALON) 200 MG capsule Take 1 capsule (200 mg total) by mouth 3 (three) times daily as needed for up to 7 days. 40 capsule Eliezer Lofts, FNP      PDMP not reviewed this encounter.   Eliezer Lofts, [place] 07/27/22 1955

## 2022-07-27 NOTE — Discharge Instructions (Addendum)
Instructed patient to take medication as directed with food to completion.  Advised may take Tessalon daily or as needed for cough.  Encouraged increase daily water intake while taking these medications.  Advised if symptoms worsen and/or unresolved please follow-up with PCP or here for further evaluation.

## 2022-07-27 NOTE — ED Triage Notes (Signed)
Pt presents with c/o cough x 3 weeks.

## 2022-08-14 DIAGNOSIS — Z419 Encounter for procedure for purposes other than remedying health state, unspecified: Secondary | ICD-10-CM | POA: Diagnosis not present

## 2022-09-14 DIAGNOSIS — Z419 Encounter for procedure for purposes other than remedying health state, unspecified: Secondary | ICD-10-CM | POA: Diagnosis not present

## 2022-10-15 DIAGNOSIS — Z419 Encounter for procedure for purposes other than remedying health state, unspecified: Secondary | ICD-10-CM | POA: Diagnosis not present

## 2022-11-02 ENCOUNTER — Ambulatory Visit
Admission: RE | Admit: 2022-11-02 | Discharge: 2022-11-02 | Disposition: A | Discharge status: Home / Self Care | Payer: Medicaid Other | Source: Ambulatory Visit | Attending: Emergency Medicine | Admitting: Emergency Medicine

## 2022-11-02 ENCOUNTER — Ambulatory Visit (INDEPENDENT_AMBULATORY_CARE_PROVIDER_SITE_OTHER): Payer: Medicaid Other

## 2022-11-02 VITALS — BP 124/80 | HR 100 | Temp 98.7°F | Resp 18 | Ht 61.0 in | Wt 137.0 lb

## 2022-11-02 DIAGNOSIS — L209 Atopic dermatitis, unspecified: Secondary | ICD-10-CM

## 2022-11-02 DIAGNOSIS — J302 Other seasonal allergic rhinitis: Secondary | ICD-10-CM

## 2022-11-02 DIAGNOSIS — J3089 Other allergic rhinitis: Secondary | ICD-10-CM | POA: Diagnosis not present

## 2022-11-02 DIAGNOSIS — R053 Chronic cough: Secondary | ICD-10-CM

## 2022-11-02 MED ORDER — CETIRIZINE HCL 10 MG PO TABS: 10.0000 mg | ORAL_TABLET | Freq: Every day | ORAL | 1 refills | Status: AC

## 2022-11-02 MED ORDER — TRIAMCINOLONE ACETONIDE 0.1 % EX CREA: 1.0000 | TOPICAL_CREAM | Freq: Two times a day (BID) | CUTANEOUS | 1 refills | Status: AC

## 2022-11-02 MED ORDER — IPRATROPIUM BROMIDE 0.06 % NA SOLN: 2.0000 | Freq: Three times a day (TID) | NASAL | 1 refills | Status: AC

## 2022-11-02 MED ORDER — FLUTICASONE PROPIONATE 50 MCG/ACT NA SUSP: 1.0000 | Freq: Every day | NASAL | 1 refills | Status: AC

## 2022-11-02 NOTE — ED Provider Notes (Signed)
[facility]    CSN: [id number] Arrival date & time: 11/02/22  1048    HISTORY   Chief Complaint  Patient presents with   Cough    Flu like symptoms. Need chest X-ray. Persistent cough since October. - Entered by patient   HPI Kelli Hill is a pleasant, 37 y.o. female who presents to urgent care today. Patient complains of a persistent cough on and off for the last 5 months.  Patient states she tested positive for COVID-19 2 weeks ago.  Patient states she is still coughing and wheezing she is concerned she has pneumonia.  Patient states she is taking Afrin and Tylenol flu and cold.  Patient has normal vital signs on arrival today, in particular she has an oxygen saturation of 100%, and is well-appearing.  Patient is requesting an x-ray during her visit today.  The history is provided by the patient.   No past medical history on file. 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   Breast pain, left 06/24/2020   Bruxism 10/31/2019   Referred ear pain, bilateral 10/31/2019   Cesarean delivery delivered 02/09/2018   Pregnancy 02/06/2018   Preterm uterine contractions in third trimester, antepartum 01/18/2018   [redacted] weeks gestation of pregnancy 12/20/2017   Persistent proteinuria 12/20/2017   Diet controlled gestational diabetes mellitus (GDM) in third trimester 12/13/2017   Gastroesophageal reflux disease 05/03/2017   Postnasal  drip 01/25/2017   Chronic maxillary sinusitis 09/24/2016   Absence of menstruation 02/18/2013   Anxiety state 02/18/2013   Breast mass 02/18/2013   Vaginitis and vulvovaginitis 02/18/2013   Depressive disorder 02/18/2013   Excessive and frequent menstruation 02/18/2013   Screening examination for sexually transmitted disease 02/18/2013   Surveillance for birth control, intrauterine device 02/18/2013   Negative pregnancy test 10/06/2012   No past surgical history on file. OB History   No obstetric history on file.    Home Medications    Prior to Admission medications   Medication Sig Start Date End Date Taking? Authorizing Provider  calcium carbonate (TUMS - DOSED IN MG ELEMENTAL CALCIUM) 500 MG chewable tablet Chew 1 tablet by mouth daily. Patient not taking: Reported on 07/27/2022    [provider]  famotidine (PEPCID) 40 MG tablet Take 40 mg by mouth at bedtime. Patient not taking: Reported on 07/27/2022 02/04/21   [provider]  lidocaine (LIDODERM) 5 % Place 1 patch onto the skin daily. Remove & Discard patch within 12 hours or as directed by MD Patient not taking: Reported on 07/27/2022 10/29/20   Samuel Bouche, NP  metFORMIN (GLUCOPHAGE XR) 500 MG 24 hr tablet Take 1 tablet (500 mg total) by mouth daily with breakfast. Patient not taking: Reported on 07/27/2022 03/06/21 03/06/22  Emeterio Reeve, DO  metroNIDAZOLE (METROGEL) 0.75 % vaginal gel 1 applicator ful 1-2 times per week for 3-6 months Patient not taking: Reported on 07/27/2022 02/24/21   Emeterio Reeve, DO  Nystatin POWD Apply liberally to affected area  2 times per day Patient not taking: Reported on 07/27/2022 10/15/20   Emeterio Reeve, DO  Semaglutide, 2 MG/DOSE, 8 MG/3ML SOPN Inject 2 mg as directed once a week. 02/04/22   Silverio Decamp, MD    Family History Family History  Problem Relation Age of Onset   Endometrial cancer Mother    Lung cancer Mother    Kidney disease Mother         stage 3   Hypertension Mother    Ovarian cancer Mother    Diabetes Maternal Grandmother    Diabetes Maternal Grandfather    Skin cancer Maternal Grandfather    Social History Social History   Tobacco Use   Smoking status: Former   Smokeless tobacco: Never  Scientific laboratory technician Use: Never used  Substance Use Topics   Alcohol use: Never   Drug use: Never   Allergies   Iodine and Contrast media [iodinated contrast media]  Review of Systems Review of Systems Pertinent findings revealed after performing a 14 point review of systems has been noted in the history of present illness.  Physical Exam Vital Signs There were no vitals taken for this visit.  No data found.  Physical Exam Vitals and nursing note reviewed.  Constitutional:      General: She is not in acute distress.    Appearance: Normal appearance. She is not ill-appearing.  HENT:     Head: Normocephalic and atraumatic.     Salivary Glands: Right salivary gland is not diffusely enlarged or tender. Left salivary gland is not diffusely enlarged or tender.     Right Ear: Ear canal and external ear normal. No drainage. A middle ear effusion is present. There is no impacted cerumen. Tympanic membrane is bulging. Tympanic membrane is not injected or erythematous.     Left Ear: Ear canal and external ear normal. No drainage. A middle ear effusion is present. There is no impacted cerumen. Tympanic membrane is bulging. Tympanic membrane is not injected or erythematous.     Ears:     Comments: Bilateral EACs normal, both TMs bulging with clear fluid    Nose: Rhinorrhea present. No nasal deformity, septal deviation, signs of injury, nasal tenderness, mucosal edema or congestion. Rhinorrhea is clear.     Right Nostril: Occlusion present. No foreign body, epistaxis or septal hematoma.     Left Nostril: Occlusion present. No foreign body, epistaxis or septal hematoma.     Right Turbinates: Enlarged, swollen and pale.      Left Turbinates: Enlarged, swollen and pale.     Right Sinus: No maxillary sinus tenderness or frontal sinus tenderness.     Left Sinus: No maxillary sinus tenderness or frontal sinus tenderness.     Mouth/Throat:     Lips: Pink. No lesions.     Mouth: Mucous membranes are moist. No oral lesions.     Pharynx: Oropharynx is clear. Uvula midline. No posterior oropharyngeal erythema or uvula swelling.     Tonsils: No tonsillar exudate. 0 on the right. 0 on the left.     Comments: Postnasal drip Eyes:     General: Lids are normal.        Right eye: No discharge.        Left eye: No discharge.     Extraocular Movements: Extraocular movements intact.     Conjunctiva/sclera: Conjunctivae normal.     Right eye: Right conjunctiva is not injected.     Left eye: Left conjunctiva is not injected.  Neck:     Trachea: Trachea and phonation normal.  Cardiovascular:     Rate and Rhythm: Normal rate and regular rhythm.     Pulses: Normal pulses.     Heart sounds: Normal heart sounds. No murmur heard.    No friction rub. No gallop.  Pulmonary:     Effort: Pulmonary effort is normal. No accessory muscle usage, prolonged expiration or respiratory distress.     Breath sounds: Normal breath sounds. No stridor, decreased air movement or transmitted upper airway sounds. No decreased breath sounds, wheezing, rhonchi or rales.  Chest:     Chest wall: No tenderness.  Musculoskeletal:        General: Normal range of motion.     Cervical back: Normal range of motion and neck supple. Normal range of motion.  Lymphadenopathy:     Cervical: No cervical adenopathy.  Skin:    General: Skin is warm and dry.     Findings: Rash (Eczematous rash on the back of both hands around thumbs) present. No erythema.  Neurological:     General: No focal deficit present.     Mental Status: She is alert and oriented to person, place, and time.  Psychiatric:        Mood and Affect: Mood normal.        Behavior: Behavior  normal.     Visual Acuity Right Eye Distance:   Left Eye Distance:   Bilateral Distance:    Right Eye Near:   Left Eye Near:    Bilateral Near:     UC Couse / Diagnostics / Procedures:     Radiology No results found.  Procedures Procedures (including critical care time) EKG  Pending results:  Labs Reviewed - No data to display  Medications Ordered in UC: Medications - No data to display  UC Diagnoses / Final Clinical Impressions(s)   I have reviewed the triage vital signs and the nursing notes.  Pertinent labs & imaging results that were available during my care of the patient were reviewed by me and considered in my medical decision making (see chart for details).    Final diagnoses:  None   Chest x-ray was unremarkable.  Patient advised.  Patient also advised that symptoms appear to be related to allergies and allergy medication prescribed.  Patient also provided with, cream for atopy on both hands.  Return precautions advised. Please see discharge instructions below for further details of plan of care as provided to patient. ED Prescriptions   None    PDMP not reviewed this encounter.  Disposition Upon Discharge:  Condition: stable for discharge home Home: take medications as prescribed; routine discharge instructions as discussed; follow up as advised.  Patient presented with an acute illness with associated systemic symptoms and significant discomfort requiring urgent management. In my opinion, this is a condition that a prudent lay person (someone who possesses an average knowledge of health and medicine) may potentially expect to result in complications if not addressed urgently such as respiratory distress, impairment of bodily function or dysfunction of bodily organs.   Routine symptom specific, illness specific and/or disease specific instructions were discussed with the patient and/or caregiver at length.   As such, the patient has been evaluated and  assessed, work-up was performed and treatment was provided in alignment with urgent care protocols and evidence based medicine.  Patient/parent/caregiver has been advised that the patient may require follow up for further testing and treatment if the symptoms continue in spite of treatment,  as clinically indicated and appropriate.  If the patient was tested for COVID-19, Influenza and/or RSV, then the patient/parent/guardian was advised to isolate at home pending the results of his/her diagnostic coronavirus test and potentially longer if they're positive. I have also advised pt that if his/her COVID-19 test returns positive, it's recommended to self-isolate for at least 10 days after symptoms first appeared AND until fever-free for 24 hours without fever reducer AND other symptoms have improved or resolved. Discussed self-isolation recommendations as well as instructions for household member/close contacts as per the [facility] and  DHHS, and also gave patient the [facility] packet with this information.  Patient/parent/caregiver has been advised to return to the [facility] or PCP in 3-5 days if no better; to PCP or the Emergency Department if new signs and symptoms develop, or if the current signs or symptoms continue to change or worsen for further workup, evaluation and treatment as clinically indicated and appropriate  The patient will follow up with their current PCP if and as advised. If the patient does not currently have a PCP we will assist them in obtaining one.   The patient may need specialty follow up if the symptoms continue, in spite of conservative treatment and management, for further workup, evaluation, consultation and treatment as clinically indicated and appropriate.  Patient/parent/caregiver verbalized understanding and agreement of plan as discussed.  All questions were addressed during visit.  Please see discharge instructions below for further details of plan.  Discharge Instructions: Discharge  Instructions   None     This office note has been dictated using Dragon speech recognition software.  Unfortunately, this method of dictation can sometimes lead to typographical or grammatical errors.  I apologize for your inconvenience in advance if this occurs.  Please do not hesitate to reach out to me if clarification is needed.      Lynden Oxford Scales, PA-C 11/02/22 [phone]

## 2022-11-02 NOTE — ED Triage Notes (Signed)
Patient states she's had a persistent cough on and off since October.  Patient did test positive for COVID x 2 weeks ago.  Patient is still coughing and wheezing.  Concern for pneumonia.  Patient is taken Affrin, Tylenol Flu and Cold.

## 2022-11-02 NOTE — Discharge Instructions (Signed)
Your chest x-ray was normal.  Your symptoms and my physical exam findings are concerning for exacerbation of your underlying allergies.     To avoid catching frequent respiratory infections, having skin reactions, dealing with eye irritation, losing sleep, missing work, etc., due to uncontrolled allergies, it is important that you begin/continue your allergy regimen and are consistent with taking your meds exactly as prescribed.   Please read below to learn more about the medications, dosages and frequencies that I recommend to help alleviate your symptoms and to get you feeling better soon: Zyrtec (cetirizine): This is an excellent second-generation antihistamine that helps to reduce respiratory inflammatory response to environmental allergens.  In some patients, this medication can cause daytime sleepiness so I recommend that you take 1 tablet daily at bedtime.     Flonase (fluticasone): This is a steroid nasal spray that used once daily, 1 spray in each nare.  This works best when used on a daily basis. This medication does not work well if it is only used when you think you need it.  After 3 to 5 days of use, you will notice significant reduction of the inflammation and mucus production that is currently being caused by exposure to allergens, whether seasonal or environmental.  The most common side effect of this medication is nosebleeds.  If you experience a nosebleed, please discontinue use for 1 week, then feel free to resume.  If you find that your insurance will not pay for this medication, please consider a different nasal steroids such as Nasonex (mometasone), or Nasacort (triamcinolone).   Atrovent (ipratropium): This is an excellent nasal decongestant spray that will not cause rebound congestion.  Please instill 2 sprays into each nare after using the nasal steroid and repeat 2 more times throughout the day.  I have added this nasal spray to the nasal steroid because nasal steroids can take  several days to reach full effect.  Once you find that you are forgetting to use the spray more often than you remember to use it, you will know that you no longer need it.     Advil, Motrin (ibuprofen): This is a good anti-inflammatory medication which not only addresses aches, pains but also significantly reduces soft tissue inflammation of the upper airways that causes sinus and nasal congestion as well as inflammation of the lower airways which makes you feel like your breathing is constricted or your cough feel tight.  I recommend that you take 400 mg every 8 hours as needed.      Topical Kenalog (triamcinolone): This is a topical steroid that can be applied to the affected area of the skin twice daily.  Please use it exactly as directed by the prescription instructions.  Different strengths of topical steroids are used to treat different areas of the body so it is important that this steroid is only applied to the areas for which it has been prescribed.  Unnecessary exposure to topical steroids can cause bleaching of the skin which can be permanent so please immediately discontinue use of this topical steroid once the skin condition is resolved.   If symptoms have not meaningfully improved in the next 5 to 7 days, please return for repeat evaluation or follow-up with your regular provider.  If symptoms have worsened in the next 3 to 5 days, please return for repeat evaluation or follow-up with your regular provider.    Thank you for visiting urgent care today.  We appreciate the opportunity to participate in your care.

## 2022-11-13 DIAGNOSIS — Z419 Encounter for procedure for purposes other than remedying health state, unspecified: Secondary | ICD-10-CM | POA: Diagnosis not present

## 2022-11-16 DIAGNOSIS — B3731 Acute candidiasis of vulva and vagina: Secondary | ICD-10-CM | POA: Diagnosis not present

## 2022-12-03 DIAGNOSIS — I10 Essential (primary) hypertension: Secondary | ICD-10-CM | POA: Diagnosis not present

## 2022-12-03 DIAGNOSIS — N854 Malposition of uterus: Secondary | ICD-10-CM | POA: Diagnosis not present

## 2022-12-03 DIAGNOSIS — D3911 Neoplasm of uncertain behavior of right ovary: Secondary | ICD-10-CM | POA: Diagnosis not present

## 2022-12-03 DIAGNOSIS — N83292 Other ovarian cyst, left side: Secondary | ICD-10-CM | POA: Diagnosis not present

## 2022-12-03 DIAGNOSIS — N9419 Other specified dyspareunia: Secondary | ICD-10-CM | POA: Diagnosis not present

## 2022-12-03 DIAGNOSIS — Z0000 Encounter for general adult medical examination without abnormal findings: Secondary | ICD-10-CM | POA: Diagnosis not present

## 2022-12-03 DIAGNOSIS — Z13220 Encounter for screening for lipoid disorders: Secondary | ICD-10-CM | POA: Diagnosis not present

## 2022-12-03 DIAGNOSIS — Z1329 Encounter for screening for other suspected endocrine disorder: Secondary | ICD-10-CM | POA: Diagnosis not present

## 2022-12-03 DIAGNOSIS — Z131 Encounter for screening for diabetes mellitus: Secondary | ICD-10-CM | POA: Diagnosis not present

## 2022-12-14 DIAGNOSIS — Z419 Encounter for procedure for purposes other than remedying health state, unspecified: Secondary | ICD-10-CM | POA: Diagnosis not present

## 2022-12-25 ENCOUNTER — Other Ambulatory Visit: Payer: Self-pay | Admitting: Sports Medicine

## 2022-12-25 DIAGNOSIS — O24410 Gestational diabetes mellitus in pregnancy, diet controlled: Secondary | ICD-10-CM

## 2023-01-13 DIAGNOSIS — Z419 Encounter for procedure for purposes other than remedying health state, unspecified: Secondary | ICD-10-CM | POA: Diagnosis not present

## 2023-02-13 DIAGNOSIS — Z419 Encounter for procedure for purposes other than remedying health state, unspecified: Secondary | ICD-10-CM | POA: Diagnosis not present

## 2023-03-13 ENCOUNTER — Other Ambulatory Visit: Payer: Self-pay | Admitting: Sports Medicine

## 2023-03-13 DIAGNOSIS — O24410 Gestational diabetes mellitus in pregnancy, diet controlled: Secondary | ICD-10-CM

## 2023-03-15 ENCOUNTER — Other Ambulatory Visit: Payer: Self-pay | Admitting: Sports Medicine

## 2023-03-15 DIAGNOSIS — Z419 Encounter for procedure for purposes other than remedying health state, unspecified: Secondary | ICD-10-CM | POA: Diagnosis not present

## 2023-03-15 DIAGNOSIS — O24410 Gestational diabetes mellitus in pregnancy, diet controlled: Secondary | ICD-10-CM

## 2023-03-16 ENCOUNTER — Encounter (INDEPENDENT_AMBULATORY_CARE_PROVIDER_SITE_OTHER): Payer: Medicaid Other | Admitting: Sports Medicine

## 2023-03-16 DIAGNOSIS — O24410 Gestational diabetes mellitus in pregnancy, diet controlled: Secondary | ICD-10-CM | POA: Diagnosis not present

## 2023-03-16 DIAGNOSIS — Z3A00 Weeks of gestation of pregnancy not specified: Secondary | ICD-10-CM

## 2023-03-17 MED ORDER — SEMAGLUTIDE (2 MG/DOSE) 8 MG/3ML ~~LOC~~ SOPN: 2.0000 mg | PEN_INJECTOR | SUBCUTANEOUS | 11 refills | Status: DC

## 2023-03-17 NOTE — Telephone Encounter (Signed)
I spent 5 total minutes of online digital evaluation and management services in this patient-initiated request for online care. 

## 2023-03-24 ENCOUNTER — Telehealth: Payer: Self-pay

## 2023-03-24 ENCOUNTER — Encounter: Payer: Self-pay | Admitting: Family Medicine

## 2023-03-24 ENCOUNTER — Ambulatory Visit (INDEPENDENT_AMBULATORY_CARE_PROVIDER_SITE_OTHER): Payer: Medicaid Other | Admitting: Family Medicine

## 2023-03-24 VITALS — BP 117/78 | HR 90 | Resp 18 | Ht 61.0 in | Wt 131.8 lb

## 2023-03-24 DIAGNOSIS — E282 Polycystic ovarian syndrome: Secondary | ICD-10-CM | POA: Diagnosis not present

## 2023-03-24 MED ORDER — SEMAGLUTIDE (1 MG/DOSE) 4 MG/3ML ~~LOC~~ SOPN: 1.0000 mg | PEN_INJECTOR | SUBCUTANEOUS | 1 refills | Status: DC

## 2023-03-24 NOTE — Assessment & Plan Note (Addendum)
She has been on ozempic for two years now.  Her starting weight prior to Ozempic was 216 pounds.  She was on Wegovy 3 years ago and had severe depression.  She was able to get it covered under insurance for weight loss and then she lost coverage after 6 months.  She was unable to get on [facility] for another 6 months with a savings card paying out of pocket.  When this ran out, she was able to be started on Ozempic that was prescribed by Dr. Karie Schwalbe.  She has been on this for over 2 years.  Today in office she is 131 pounds.  With a BMI of 24 I am unsure if insurance will cover but we will try. A1C done in 2021 was 5.3 -I did have a long discussion with her about what our end weight goal is and that I do not want her to be on this lifelong.  We discussed titrating down on her Ozempic.

## 2023-03-24 NOTE — Progress Notes (Signed)
Established patient visit   Patient: Kelli Hill   DOB: December 11, 1985   37 y.o. Female  MRN: [id number] Visit Date: 03/24/2023  Today's healthcare provider: Charlton Amor, DO   Chief Complaint  Patient presents with   Establish Care    Pt comes in today to have her ozempic refilled and establish care    SUBJECTIVE    Chief Complaint  Patient presents with   Establish Care    Pt comes in today to have her ozempic refilled and establish care   HPI HPI     Establish Care    Additional comments: Pt comes in today to have her ozempic refilled and establish care      Last edited by Roselyn Reef, CMA on 03/24/2023  1:57 PM.      Pt presents to transfer care. She is wanting a refill on her ozempic.   Review of Systems  Constitutional:  Negative for activity change, fatigue and fever.  Respiratory:  Negative for cough and shortness of breath.   Cardiovascular:  Negative for chest pain.  Gastrointestinal:  Negative for abdominal pain.  Genitourinary:  Negative for difficulty urinating.       Current Meds  Medication Sig   Semaglutide, 1 MG/DOSE, 4 MG/3ML SOPN Inject 1 mg as directed once a week.    OBJECTIVE    BP 117/78 (BP Location: Left Arm, Patient Position: Sitting, Cuff Size: Normal)   Pulse 90   Resp 18   Ht 5\' 1"  (1.549 m)   Wt 131 lb 12 oz (59.8 kg)   SpO2 100%   BMI 24.89 kg/m   Physical Exam Vitals and nursing note reviewed.  Constitutional:      General: She is not in acute distress.    Appearance: Normal appearance.  HENT:     Head: Normocephalic and atraumatic.     Right Ear: External ear normal.     Left Ear: External ear normal.     Nose: Nose normal.  Eyes:     Conjunctiva/sclera: Conjunctivae normal.  Cardiovascular:     Rate and Rhythm: Normal rate and regular rhythm.  Pulmonary:     Effort: Pulmonary effort is normal.     Breath sounds: Normal breath sounds.  Neurological:     General: No focal deficit present.     Mental  Status: She is alert and oriented to person, place, and time.  Psychiatric:        Mood and Affect: Mood normal.        Behavior: Behavior normal.        Thought Content: Thought content normal.        Judgment: Judgment normal.          ASSESSMENT & PLAN    Problem List Items Addressed This Visit       Endocrine   Polycystic ovaries - Primary    She has been on ozempic for two years now.  Her starting weight prior to Ozempic was 216 pounds.  She was on Wegovy 3 years ago and had severe depression.  She was able to get it covered under insurance for weight loss and then she lost coverage after 6 months.  She was unable to get on [facility] for another 6 months with a savings card paying out of pocket.  When this ran out, she was able to be started on Ozempic that was prescribed by Dr. Karie Schwalbe.  She has been on this for over 2 years.  Today in office she is 131 pounds.  With a BMI of 24 I am unsure if insurance will cover but we will try. A1C done in 2021 was 5.3 -I did have a long discussion with her about what our end weight goal is and that I do not want her to be on this lifelong.  We discussed titrating down on her Ozempic.       Return if symptoms worsen or fail to improve.      Meds ordered this encounter  Medications   Semaglutide, 1 MG/DOSE, 4 MG/3ML SOPN    Sig: Inject 1 mg as directed once a week.    Dispense:  3 mL    Refill:  1    No orders of the defined types were placed in this encounter.    Charlton Amor, DO  [facility] at [facility] [phone] (phone) [phone] (fax)  [facility]

## 2023-03-24 NOTE — Telephone Encounter (Addendum)
Initiated Prior authorization ZOX:[id number] (1 MG/DOSE) 4MG /3ML pen-injectors Via: Covermymeds Case/Key:[id number] Status: approved as of 03/24/23 Reason:Approved. This drug has been approved. Approved quantity: 3 mls per 28 day(s). You may fill up to a 34 day supply at a retail pharmacy. You may fill up to a 90 day supply for maintenance drugs, please refer to the formulary for details. Please call the pharmacy to process your prescription claim.. Authorization Expiration Date: March 23, 2024. Notified Pt via: Mychart  Please note: Last seen:03/15/23 BMI 24.89 Last A1c:5.3 on 05/29/20

## 2023-04-02 ENCOUNTER — Telehealth: Payer: Self-pay

## 2023-04-02 NOTE — Telephone Encounter (Signed)
LVM for patient to call back [phone], or to call PCP office to schedule follow up apt. AS, CMA  

## 2023-04-15 DIAGNOSIS — Z419 Encounter for procedure for purposes other than remedying health state, unspecified: Secondary | ICD-10-CM | POA: Diagnosis not present

## 2023-05-12 ENCOUNTER — Encounter: Payer: Self-pay | Admitting: Family Medicine

## 2023-05-12 ENCOUNTER — Ambulatory Visit (INDEPENDENT_AMBULATORY_CARE_PROVIDER_SITE_OTHER): Payer: Medicaid Other | Admitting: Family Medicine

## 2023-05-12 VITALS — BP 117/84 | HR 98 | Resp 18 | Ht 61.0 in | Wt 132.8 lb

## 2023-05-12 DIAGNOSIS — Z0000 Encounter for general adult medical examination without abnormal findings: Secondary | ICD-10-CM | POA: Diagnosis not present

## 2023-05-12 DIAGNOSIS — F422 Mixed obsessional thoughts and acts: Secondary | ICD-10-CM | POA: Diagnosis not present

## 2023-05-12 MED ORDER — SEMAGLUTIDE (1 MG/DOSE) 4 MG/3ML ~~LOC~~ SOPN: 1.0000 mg | PEN_INJECTOR | SUBCUTANEOUS | 4 refills | Status: DC

## 2023-05-12 NOTE — Progress Notes (Signed)
Established patient visit   Patient: Kelli Hill   DOB: 1986-07-11   37 y.o. Female  MRN: [id number] Visit Date: 05/12/2023  Today's healthcare provider: Charlton Amor, DO   Chief Complaint  Patient presents with   Annual Exam    SUBJECTIVE    Chief Complaint  Patient presents with   Annual Exam   HPI   Pt presents for wellness exam.  Diet: improving  Exercise: has been walking    Htn: mom DM: none Cancers: MGM-lung cancer, MGP- bone cancer, Mom-endometrial cancer   Smoking: 5 cigarettes per day  Would like referral to therapy.   Review of Systems  Constitutional:  Negative for activity change, fatigue and fever.  Respiratory:  Negative for cough and shortness of breath.   Cardiovascular:  Negative for chest pain.  Gastrointestinal:  Negative for abdominal pain.  Genitourinary:  Negative for difficulty urinating.       Current Meds  Medication Sig   fluticasone (FLONASE) 50 MCG/ACT nasal spray Place 1 spray into both nostrils daily.   ipratropium (ATROVENT) 0.06 % nasal spray Place 2 sprays into both nostrils 3 (three) times daily. As needed for nasal congestion, runny nose   triamcinolone cream (KENALOG) 0.1 % Apply 1 Application topically 2 (two) times daily. Apply to affected area(s) twice daily , do not apply to face.   [DISCONTINUED] Semaglutide, 1 MG/DOSE, 4 MG/3ML SOPN Inject 1 mg as directed once a week.    OBJECTIVE    BP 117/84 (BP Location: Left Arm, Patient Position: Sitting, Cuff Size: Normal)   Pulse 98   Resp 18   Ht 5\' 1"  (1.549 m)   Wt 132 lb 12 oz (60.2 kg)   SpO2 99%   BMI 25.08 kg/m   Physical Exam Vitals and nursing note reviewed.  Constitutional:      General: She is not in acute distress.    Appearance: Normal appearance.  HENT:     Head: Normocephalic and atraumatic.     Right Ear: External ear normal.     Left Ear: External ear normal.     Nose: Nose normal.  Eyes:     Conjunctiva/sclera: Conjunctivae normal.   Cardiovascular:     Rate and Rhythm: Normal rate and regular rhythm.  Pulmonary:     Effort: Pulmonary effort is normal.     Breath sounds: Normal breath sounds.  Neurological:     General: No focal deficit present.     Mental Status: She is alert and oriented to person, place, and time.  Psychiatric:        Mood and Affect: Mood normal.        Behavior: Behavior normal.        Thought Content: Thought content normal.        Judgment: Judgment normal.        ASSESSMENT & PLAN    Problem List Items Addressed This Visit       Other   Obsessive compulsive disorder   Relevant Orders   Ambulatory referral to Psychiatry   Ambulatory referral to Psychology   Other Visit Diagnoses     Routine adult health maintenance    -  Primary   Relevant Orders   Basic Metabolic Panel (BMET)   CBC   Lipid panel   HIV antibody (with reflex)   Hepatitis C Antibody       No follow-ups on file.      Meds ordered this encounter  Medications  Semaglutide, 1 MG/DOSE, 4 MG/3ML SOPN    Sig: Inject 1 mg as directed once a week.    Dispense:  3 mL    Refill:  4    Orders Placed This Encounter  Procedures   Basic Metabolic Panel (BMET)   CBC   Lipid panel   HIV antibody (with reflex)   Hepatitis C Antibody   Ambulatory referral to Psychiatry    Referral Priority:   Routine    Referral Type:   Psychiatric    Referral Reason:   Specialty Services Required    Requested Specialty:   Psychiatry    Number of Visits Requested:   1   Ambulatory referral to Psychology    Referral Priority:   Routine    Referral Type:   Psychiatric    Referral Reason:   Specialty Services Required    Requested Specialty:   Psychology    Number of Visits Requested:   1     Charlton Amor, DO  [facility] at [facility] [phone] (phone) [phone] (fax)  [facility]

## 2023-05-13 LAB — CBC
Hematocrit: 29 % — ABNORMAL LOW (ref 34.0–46.6)
Hemoglobin: 8.6 g/dL — ABNORMAL LOW (ref 11.1–15.9)
MCH: 21.4 pg — ABNORMAL LOW (ref 26.6–33.0)
MCHC: 29.7 g/dL — ABNORMAL LOW (ref 31.5–35.7)
MCV: 72 fL — ABNORMAL LOW (ref 79–97)
Platelets: 335 10*3/uL (ref 150–450)
RBC: 4.01 x10E6/uL (ref 3.77–5.28)
RDW: 14.9 % (ref 11.7–15.4)
WBC: 4.6 10*3/uL (ref 3.4–10.8)

## 2023-05-13 LAB — BASIC METABOLIC PANEL
BUN/Creatinine Ratio: 10 (ref 9–23)
BUN: 11 mg/dL (ref 6–20)
CO2: 23 mmol/L (ref 20–29)
Calcium: 9.4 mg/dL (ref 8.7–10.2)
Chloride: 104 mmol/L (ref 96–106)
Creatinine, Ser: 1.05 mg/dL — ABNORMAL HIGH (ref 0.57–1.00)
Glucose: 100 mg/dL — ABNORMAL HIGH (ref 70–99)
Potassium: 4.3 mmol/L (ref 3.5–5.2)
Sodium: 139 mmol/L (ref 134–144)
eGFR: 71 mL/min/{1.73_m2} (ref >59)

## 2023-05-13 LAB — LIPID PANEL
Chol/HDL Ratio: 2.3 ratio (ref 0.0–4.4)
Cholesterol, Total: 161 mg/dL (ref 100–199)
HDL: 71 mg/dL (ref >39)
LDL Chol Calc (NIH): 76 mg/dL (ref 0–99)
Triglycerides: 73 mg/dL (ref 0–149)
VLDL Cholesterol Cal: 14 mg/dL (ref 5–40)

## 2023-05-13 LAB — HEPATITIS C ANTIBODY: Hep C Virus Ab: NONREACTIVE

## 2023-05-13 LAB — HIV ANTIBODY (ROUTINE TESTING W REFLEX): HIV Screen 4th Generation wRfx: NONREACTIVE

## 2023-05-15 ENCOUNTER — Other Ambulatory Visit: Payer: Self-pay | Admitting: Family Medicine

## 2023-05-16 DIAGNOSIS — Z419 Encounter for procedure for purposes other than remedying health state, unspecified: Secondary | ICD-10-CM | POA: Diagnosis not present

## 2023-06-04 DIAGNOSIS — F4312 Post-traumatic stress disorder, chronic: Secondary | ICD-10-CM | POA: Insufficient documentation

## 2023-06-04 DIAGNOSIS — F422 Mixed obsessional thoughts and acts: Secondary | ICD-10-CM | POA: Diagnosis not present

## 2023-06-05 DIAGNOSIS — F422 Mixed obsessional thoughts and acts: Secondary | ICD-10-CM | POA: Diagnosis not present

## 2023-06-15 DIAGNOSIS — Z419 Encounter for procedure for purposes other than remedying health state, unspecified: Secondary | ICD-10-CM | POA: Diagnosis not present

## 2023-06-18 DIAGNOSIS — F4312 Post-traumatic stress disorder, chronic: Secondary | ICD-10-CM | POA: Diagnosis not present

## 2023-06-18 DIAGNOSIS — F422 Mixed obsessional thoughts and acts: Secondary | ICD-10-CM | POA: Diagnosis not present

## 2023-06-21 DIAGNOSIS — F422 Mixed obsessional thoughts and acts: Secondary | ICD-10-CM | POA: Diagnosis not present

## 2023-06-21 DIAGNOSIS — F4312 Post-traumatic stress disorder, chronic: Secondary | ICD-10-CM | POA: Diagnosis not present

## 2023-07-01 DIAGNOSIS — F4312 Post-traumatic stress disorder, chronic: Secondary | ICD-10-CM | POA: Diagnosis not present

## 2023-07-01 DIAGNOSIS — F422 Mixed obsessional thoughts and acts: Secondary | ICD-10-CM | POA: Diagnosis not present

## 2023-07-07 DIAGNOSIS — F422 Mixed obsessional thoughts and acts: Secondary | ICD-10-CM | POA: Diagnosis not present

## 2023-07-14 DIAGNOSIS — F422 Mixed obsessional thoughts and acts: Secondary | ICD-10-CM | POA: Diagnosis not present

## 2023-07-15 DIAGNOSIS — F422 Mixed obsessional thoughts and acts: Secondary | ICD-10-CM | POA: Diagnosis not present

## 2023-07-15 DIAGNOSIS — F4312 Post-traumatic stress disorder, chronic: Secondary | ICD-10-CM | POA: Diagnosis not present

## 2023-07-16 DIAGNOSIS — Z419 Encounter for procedure for purposes other than remedying health state, unspecified: Secondary | ICD-10-CM | POA: Diagnosis not present

## 2023-07-29 DIAGNOSIS — F422 Mixed obsessional thoughts and acts: Secondary | ICD-10-CM | POA: Diagnosis not present

## 2023-08-15 DIAGNOSIS — Z419 Encounter for procedure for purposes other than remedying health state, unspecified: Secondary | ICD-10-CM | POA: Diagnosis not present

## 2023-08-20 DIAGNOSIS — F17210 Nicotine dependence, cigarettes, uncomplicated: Secondary | ICD-10-CM | POA: Diagnosis not present

## 2023-08-20 DIAGNOSIS — Z0001 Encounter for general adult medical examination with abnormal findings: Secondary | ICD-10-CM | POA: Diagnosis not present

## 2023-08-20 DIAGNOSIS — E282 Polycystic ovarian syndrome: Secondary | ICD-10-CM | POA: Diagnosis not present

## 2023-08-23 DIAGNOSIS — F422 Mixed obsessional thoughts and acts: Secondary | ICD-10-CM | POA: Diagnosis not present

## 2023-08-26 DIAGNOSIS — F422 Mixed obsessional thoughts and acts: Secondary | ICD-10-CM | POA: Diagnosis not present

## 2023-08-26 DIAGNOSIS — F4312 Post-traumatic stress disorder, chronic: Secondary | ICD-10-CM | POA: Diagnosis not present

## 2023-09-07 DIAGNOSIS — F422 Mixed obsessional thoughts and acts: Secondary | ICD-10-CM | POA: Diagnosis not present

## 2023-09-07 DIAGNOSIS — F4312 Post-traumatic stress disorder, chronic: Secondary | ICD-10-CM | POA: Diagnosis not present

## 2023-09-13 DIAGNOSIS — F422 Mixed obsessional thoughts and acts: Secondary | ICD-10-CM | POA: Diagnosis not present

## 2023-09-15 DIAGNOSIS — Z419 Encounter for procedure for purposes other than remedying health state, unspecified: Secondary | ICD-10-CM | POA: Diagnosis not present

## 2023-09-28 DIAGNOSIS — F422 Mixed obsessional thoughts and acts: Secondary | ICD-10-CM | POA: Diagnosis not present

## 2023-09-28 DIAGNOSIS — F4312 Post-traumatic stress disorder, chronic: Secondary | ICD-10-CM | POA: Diagnosis not present

## 2023-10-06 ENCOUNTER — Other Ambulatory Visit: Payer: Self-pay | Admitting: Family Medicine

## 2023-10-16 DIAGNOSIS — Z419 Encounter for procedure for purposes other than remedying health state, unspecified: Secondary | ICD-10-CM | POA: Diagnosis not present

## 2023-10-19 DIAGNOSIS — F4312 Post-traumatic stress disorder, chronic: Secondary | ICD-10-CM | POA: Diagnosis not present

## 2023-10-19 DIAGNOSIS — F422 Mixed obsessional thoughts and acts: Secondary | ICD-10-CM | POA: Diagnosis not present

## 2023-10-26 DIAGNOSIS — F4312 Post-traumatic stress disorder, chronic: Secondary | ICD-10-CM | POA: Diagnosis not present

## 2023-10-26 DIAGNOSIS — F422 Mixed obsessional thoughts and acts: Secondary | ICD-10-CM | POA: Diagnosis not present

## 2023-10-27 DIAGNOSIS — F4312 Post-traumatic stress disorder, chronic: Secondary | ICD-10-CM | POA: Diagnosis not present

## 2023-10-27 DIAGNOSIS — F422 Mixed obsessional thoughts and acts: Secondary | ICD-10-CM | POA: Diagnosis not present

## 2023-11-01 ENCOUNTER — Other Ambulatory Visit: Payer: Self-pay | Admitting: Family Medicine

## 2023-11-02 DIAGNOSIS — F422 Mixed obsessional thoughts and acts: Secondary | ICD-10-CM | POA: Diagnosis not present

## 2023-11-02 DIAGNOSIS — F4312 Post-traumatic stress disorder, chronic: Secondary | ICD-10-CM | POA: Diagnosis not present

## 2023-11-09 DIAGNOSIS — F422 Mixed obsessional thoughts and acts: Secondary | ICD-10-CM | POA: Diagnosis not present

## 2023-11-09 DIAGNOSIS — F4312 Post-traumatic stress disorder, chronic: Secondary | ICD-10-CM | POA: Diagnosis not present

## 2023-11-13 DIAGNOSIS — Z419 Encounter for procedure for purposes other than remedying health state, unspecified: Secondary | ICD-10-CM | POA: Diagnosis not present

## 2023-11-16 DIAGNOSIS — F422 Mixed obsessional thoughts and acts: Secondary | ICD-10-CM | POA: Diagnosis not present

## 2023-11-16 DIAGNOSIS — F4312 Post-traumatic stress disorder, chronic: Secondary | ICD-10-CM | POA: Diagnosis not present

## 2023-11-30 DIAGNOSIS — F422 Mixed obsessional thoughts and acts: Secondary | ICD-10-CM | POA: Diagnosis not present

## 2023-11-30 DIAGNOSIS — F4312 Post-traumatic stress disorder, chronic: Secondary | ICD-10-CM | POA: Diagnosis not present

## 2023-12-01 DIAGNOSIS — F422 Mixed obsessional thoughts and acts: Secondary | ICD-10-CM | POA: Diagnosis not present

## 2023-12-01 DIAGNOSIS — F4312 Post-traumatic stress disorder, chronic: Secondary | ICD-10-CM | POA: Diagnosis not present

## 2023-12-07 DIAGNOSIS — F4312 Post-traumatic stress disorder, chronic: Secondary | ICD-10-CM | POA: Diagnosis not present

## 2023-12-07 DIAGNOSIS — F422 Mixed obsessional thoughts and acts: Secondary | ICD-10-CM | POA: Diagnosis not present

## 2023-12-10 ENCOUNTER — Other Ambulatory Visit: Payer: Self-pay | Admitting: Physician Assistant

## 2023-12-14 DIAGNOSIS — F4312 Post-traumatic stress disorder, chronic: Secondary | ICD-10-CM | POA: Diagnosis not present

## 2023-12-14 DIAGNOSIS — F422 Mixed obsessional thoughts and acts: Secondary | ICD-10-CM | POA: Diagnosis not present

## 2023-12-21 DIAGNOSIS — F4312 Post-traumatic stress disorder, chronic: Secondary | ICD-10-CM | POA: Diagnosis not present

## 2023-12-21 DIAGNOSIS — F422 Mixed obsessional thoughts and acts: Secondary | ICD-10-CM | POA: Diagnosis not present

## 2023-12-25 DIAGNOSIS — Z419 Encounter for procedure for purposes other than remedying health state, unspecified: Secondary | ICD-10-CM | POA: Diagnosis not present

## 2023-12-28 DIAGNOSIS — F422 Mixed obsessional thoughts and acts: Secondary | ICD-10-CM | POA: Diagnosis not present

## 2023-12-28 DIAGNOSIS — F4312 Post-traumatic stress disorder, chronic: Secondary | ICD-10-CM | POA: Diagnosis not present

## 2024-01-08 ENCOUNTER — Other Ambulatory Visit: Payer: Self-pay | Admitting: Physician Assistant

## 2024-01-11 DIAGNOSIS — F4312 Post-traumatic stress disorder, chronic: Secondary | ICD-10-CM | POA: Diagnosis not present

## 2024-01-11 DIAGNOSIS — F422 Mixed obsessional thoughts and acts: Secondary | ICD-10-CM | POA: Diagnosis not present

## 2024-01-18 DIAGNOSIS — F4312 Post-traumatic stress disorder, chronic: Secondary | ICD-10-CM | POA: Diagnosis not present

## 2024-01-18 DIAGNOSIS — F422 Mixed obsessional thoughts and acts: Secondary | ICD-10-CM | POA: Diagnosis not present

## 2024-01-20 ENCOUNTER — Encounter: Payer: Self-pay | Admitting: Family Medicine

## 2024-01-21 LAB — HM PAP SMEAR: HM Pap smear: NORMAL

## 2024-01-24 DIAGNOSIS — Z419 Encounter for procedure for purposes other than remedying health state, unspecified: Secondary | ICD-10-CM | POA: Diagnosis not present

## 2024-01-25 DIAGNOSIS — F422 Mixed obsessional thoughts and acts: Secondary | ICD-10-CM | POA: Diagnosis not present

## 2024-01-25 DIAGNOSIS — F4312 Post-traumatic stress disorder, chronic: Secondary | ICD-10-CM | POA: Diagnosis not present

## 2024-02-08 DIAGNOSIS — F422 Mixed obsessional thoughts and acts: Secondary | ICD-10-CM | POA: Diagnosis not present

## 2024-02-08 DIAGNOSIS — F4312 Post-traumatic stress disorder, chronic: Secondary | ICD-10-CM | POA: Diagnosis not present

## 2024-02-15 ENCOUNTER — Other Ambulatory Visit: Payer: Self-pay | Admitting: Physician Assistant

## 2024-02-15 DIAGNOSIS — F4312 Post-traumatic stress disorder, chronic: Secondary | ICD-10-CM | POA: Diagnosis not present

## 2024-02-15 DIAGNOSIS — F422 Mixed obsessional thoughts and acts: Secondary | ICD-10-CM | POA: Diagnosis not present

## 2024-02-22 DIAGNOSIS — F422 Mixed obsessional thoughts and acts: Secondary | ICD-10-CM | POA: Diagnosis not present

## 2024-02-22 DIAGNOSIS — F4312 Post-traumatic stress disorder, chronic: Secondary | ICD-10-CM | POA: Diagnosis not present

## 2024-02-24 DIAGNOSIS — Z419 Encounter for procedure for purposes other than remedying health state, unspecified: Secondary | ICD-10-CM | POA: Diagnosis not present

## 2024-02-29 DIAGNOSIS — F4312 Post-traumatic stress disorder, chronic: Secondary | ICD-10-CM | POA: Diagnosis not present

## 2024-02-29 DIAGNOSIS — F422 Mixed obsessional thoughts and acts: Secondary | ICD-10-CM | POA: Diagnosis not present

## 2024-03-07 DIAGNOSIS — F422 Mixed obsessional thoughts and acts: Secondary | ICD-10-CM | POA: Diagnosis not present

## 2024-03-07 DIAGNOSIS — F410 Panic disorder [episodic paroxysmal anxiety] without agoraphobia: Secondary | ICD-10-CM | POA: Diagnosis not present

## 2024-03-07 DIAGNOSIS — F411 Generalized anxiety disorder: Secondary | ICD-10-CM | POA: Diagnosis not present

## 2024-03-07 DIAGNOSIS — F4312 Post-traumatic stress disorder, chronic: Secondary | ICD-10-CM | POA: Diagnosis not present

## 2024-03-13 ENCOUNTER — Telehealth: Payer: Self-pay

## 2024-03-13 ENCOUNTER — Other Ambulatory Visit (HOSPITAL_COMMUNITY): Payer: Self-pay

## 2024-03-13 NOTE — Telephone Encounter (Signed)
 Pharmacy Patient Advocate Encounter   Received notification from CoverMyMeds that prior authorization renewal for Ozempic  4mg /75ml is required/requested.   Insurance verification completed.   The patient is insured through [employer] .   Ozempic /Mounjaro  is approved exclusively as an adjunct to diet and exercise to improve glycemic  control in adults with type 2 diabetes mellitus. A review of patient's medical chart reveals no documented diagnosis of type 2 diabetes or an A1C indicative of diabetes. Therefore, they do not  currently meet the criteria for prior authorization of this medication. If clinically appropriate, alternative  options such as Saxenda, Zepbound , or Wegovy  may be considered for this patient.

## 2024-03-14 DIAGNOSIS — F4312 Post-traumatic stress disorder, chronic: Secondary | ICD-10-CM | POA: Diagnosis not present

## 2024-03-14 DIAGNOSIS — F411 Generalized anxiety disorder: Secondary | ICD-10-CM | POA: Diagnosis not present

## 2024-03-14 DIAGNOSIS — F422 Mixed obsessional thoughts and acts: Secondary | ICD-10-CM | POA: Diagnosis not present

## 2024-03-14 DIAGNOSIS — F410 Panic disorder [episodic paroxysmal anxiety] without agoraphobia: Secondary | ICD-10-CM | POA: Diagnosis not present

## 2024-03-16 NOTE — Telephone Encounter (Signed)
 Please review the TE from the CPhT regarding the denial for Ozempic .

## 2024-03-21 DIAGNOSIS — F411 Generalized anxiety disorder: Secondary | ICD-10-CM | POA: Diagnosis not present

## 2024-03-21 DIAGNOSIS — F410 Panic disorder [episodic paroxysmal anxiety] without agoraphobia: Secondary | ICD-10-CM | POA: Diagnosis not present

## 2024-03-21 DIAGNOSIS — F4312 Post-traumatic stress disorder, chronic: Secondary | ICD-10-CM | POA: Diagnosis not present

## 2024-03-21 DIAGNOSIS — F422 Mixed obsessional thoughts and acts: Secondary | ICD-10-CM | POA: Diagnosis not present

## 2024-03-25 DIAGNOSIS — Z419 Encounter for procedure for purposes other than remedying health state, unspecified: Secondary | ICD-10-CM | POA: Diagnosis not present

## 2024-04-04 DIAGNOSIS — F422 Mixed obsessional thoughts and acts: Secondary | ICD-10-CM | POA: Diagnosis not present

## 2024-04-04 DIAGNOSIS — F4312 Post-traumatic stress disorder, chronic: Secondary | ICD-10-CM | POA: Diagnosis not present

## 2024-04-04 DIAGNOSIS — F411 Generalized anxiety disorder: Secondary | ICD-10-CM | POA: Diagnosis not present

## 2024-04-04 DIAGNOSIS — F410 Panic disorder [episodic paroxysmal anxiety] without agoraphobia: Secondary | ICD-10-CM | POA: Diagnosis not present

## 2024-04-10 ENCOUNTER — Ambulatory Visit (INDEPENDENT_AMBULATORY_CARE_PROVIDER_SITE_OTHER): Admitting: Urgent Care

## 2024-04-10 VITALS — BP 115/72 | HR 99 | Ht 61.0 in | Wt 157.0 lb

## 2024-04-10 DIAGNOSIS — E282 Polycystic ovarian syndrome: Secondary | ICD-10-CM

## 2024-04-10 DIAGNOSIS — D649 Anemia, unspecified: Secondary | ICD-10-CM | POA: Diagnosis not present

## 2024-04-10 DIAGNOSIS — Z6829 Body mass index (BMI) 29.0-29.9, adult: Secondary | ICD-10-CM | POA: Diagnosis not present

## 2024-04-10 DIAGNOSIS — R635 Abnormal weight gain: Secondary | ICD-10-CM | POA: Diagnosis not present

## 2024-04-10 MED ORDER — WEGOVY 1.7 MG/0.75ML ~~LOC~~ SOAJ: 1.7000 mg | SUBCUTANEOUS | 2 refills | Status: DC

## 2024-04-10 NOTE — Progress Notes (Unsigned)
 Established Patient Office Visit  Subjective   Patient ID: Kelli Hill, female    DOB: Jan 30, 1986  Age: 38 y.o. MRN: [id number]  Chief Complaint  Patient presents with   Hyperglycemia    Requesting a refill on Ozempic    Patient is here today for a transfer of care. She previously was seen by Dr. Bevin. She has a history of PCOS, hypertension, hyperglycemia, GERD, iron deficiency anemia, depression, and OCD. Patient is requesting a refill of Ozempic , which she has been on for roughly 2 years. Patient previously struggled with weight gain and PCOS after she had her twins and was unable to lose weight. Patient started on Wegovy  and her PCOS symptoms improved. Patient switched to mounjaro  for affordability and then after switched to Ozempic . Patient has been on maintenance dose of Ozempic  and has been tolerating it well. She is requesting refills and is inquiring whether or not her insurance will approve this weight loss medication.   Review of Systems  All other systems reviewed and are negative.     Objective:     BP 115/72   Pulse 99   Ht 5' 1 (1.549 m)   Wt 71.2 kg   SpO2 100%   BMI 29.66 kg/m  BP Readings from Last 3 Encounters:  04/10/24 115/72  05/12/23 117/84  03/24/23 117/78   Wt Readings from Last 3 Encounters:  04/10/24 71.2 kg  05/12/23 60.2 kg  03/24/23 59.8 kg     Physical Exam Vitals reviewed.  Constitutional:      Appearance: Normal appearance.  Cardiovascular:     Rate and Rhythm: Normal rate and regular rhythm.     Pulses: Normal pulses.  Pulmonary:     Effort: Pulmonary effort is normal.  Abdominal:     General: Abdomen is flat.  Musculoskeletal:        General: Normal range of motion.     Cervical back: Normal range of motion.  Skin:    General: Skin is warm.  Neurological:     General: No focal deficit present.     Mental Status: She is alert and oriented to person, place, and time. Mental status is at baseline.  Psychiatric:        Mood  and Affect: Mood normal.        Behavior: Behavior normal.        Thought Content: Thought content normal.        Judgment: Judgment normal.      The ASCVD Risk score (Arnett DK, et al., 2019) failed to calculate for the following reasons:   The 2019 ASCVD risk score is only valid for ages 20 to 40    Assessment & Plan:   Problem List Items Addressed This Visit       Endocrine   Polycystic ovaries - Primary   Relevant Medications   WEGOVY  1.7 MG/0.75ML SOAJ   Other Relevant Orders   Hemoglobin A1c   TSH     Other   Weight gain   Relevant Medications   WEGOVY  1.7 MG/0.75ML SOAJ   Other Relevant Orders   CBC with Differential/Platelet   Hemoglobin A1c   TSH   Comprehensive metabolic panel with GFR   Other Visit Diagnoses       Anemia, unspecified type       Relevant Orders   CBC with Differential/Platelet   Comprehensive metabolic panel with GFR   Iron, TIBC and Ferritin Panel     BMI 29.0-29.9,adult  Relevant Medications   WEGOVY  1.7 MG/0.75ML SOAJ      PCOS  Weight gain  BMI 29.0- 29.9 Hx of hyperglycemia  - Wegovy  prescription sent into pharmacy, will start at lower dose and if tolerated consider increasing to maximum dosage in 4-5 weeks  - A1C, CMP, TSH labs drawn today   Anemia- hx of iron deficiency  - CBC, CMP, iron panel labs drawn today  Return in about 5 weeks (around 05/15/2024) for Annual Physical.    Tinnie FORBES Patient, Student-PA

## 2024-04-10 NOTE — Patient Instructions (Signed)
 Start Wegovy  1.7mg  weekly x 4 weeks. Labs drawn today.  Please schedule annual PE with fasting labs end of August.

## 2024-04-11 ENCOUNTER — Encounter: Payer: Self-pay | Admitting: Urgent Care

## 2024-04-11 ENCOUNTER — Ambulatory Visit: Payer: Self-pay | Admitting: Urgent Care

## 2024-04-11 DIAGNOSIS — F411 Generalized anxiety disorder: Secondary | ICD-10-CM | POA: Diagnosis not present

## 2024-04-11 DIAGNOSIS — F4312 Post-traumatic stress disorder, chronic: Secondary | ICD-10-CM | POA: Diagnosis not present

## 2024-04-11 DIAGNOSIS — D649 Anemia, unspecified: Secondary | ICD-10-CM

## 2024-04-11 DIAGNOSIS — F422 Mixed obsessional thoughts and acts: Secondary | ICD-10-CM | POA: Diagnosis not present

## 2024-04-11 DIAGNOSIS — F410 Panic disorder [episodic paroxysmal anxiety] without agoraphobia: Secondary | ICD-10-CM | POA: Diagnosis not present

## 2024-04-11 LAB — CBC WITH DIFFERENTIAL/PLATELET
Basophils Absolute: 0 x10E3/uL (ref 0.0–0.2)
Basos: 1 %
EOS (ABSOLUTE): 0.1 x10E3/uL (ref 0.0–0.4)
Eos: 2 %
Hematocrit: 35.1 % (ref 34.0–46.6)
Hemoglobin: 10.3 g/dL — ABNORMAL LOW (ref 11.1–15.9)
Immature Grans (Abs): 0 x10E3/uL (ref 0.0–0.1)
Immature Granulocytes: 0 %
Lymphocytes Absolute: 1.4 x10E3/uL (ref 0.7–3.1)
Lymphs: 25 %
MCH: 22.2 pg — ABNORMAL LOW (ref 26.6–33.0)
MCHC: 29.3 g/dL — ABNORMAL LOW (ref 31.5–35.7)
MCV: 76 fL — ABNORMAL LOW (ref 79–97)
Monocytes Absolute: 0.6 x10E3/uL (ref 0.1–0.9)
Monocytes: 11 %
Neutrophils Absolute: 3.4 x10E3/uL (ref 1.4–7.0)
Neutrophils: 61 %
Platelets: 340 x10E3/uL (ref 150–450)
RBC: 4.65 x10E6/uL (ref 3.77–5.28)
RDW: 15.2 % (ref 11.7–15.4)
WBC: 5.5 x10E3/uL (ref 3.4–10.8)

## 2024-04-11 LAB — COMPREHENSIVE METABOLIC PANEL WITH GFR
ALT: 12 IU/L (ref 0–32)
AST: 16 IU/L (ref 0–40)
Albumin: 4.1 g/dL (ref 3.9–4.9)
Alkaline Phosphatase: 78 IU/L (ref 44–121)
BUN/Creatinine Ratio: 17 (ref 9–23)
BUN: 17 mg/dL (ref 6–20)
Bilirubin Total: 0.3 mg/dL (ref 0.0–1.2)
CO2: 20 mmol/L (ref 20–29)
Calcium: 9.4 mg/dL (ref 8.7–10.2)
Chloride: 104 mmol/L (ref 96–106)
Creatinine, Ser: 1.02 mg/dL — ABNORMAL HIGH (ref 0.57–1.00)
Globulin, Total: 2.8 g/dL (ref 1.5–4.5)
Glucose: 80 mg/dL (ref 70–99)
Potassium: 4.4 mmol/L (ref 3.5–5.2)
Sodium: 138 mmol/L (ref 134–144)
Total Protein: 6.9 g/dL (ref 6.0–8.5)
eGFR: 73 mL/min/1.73 (ref >59)

## 2024-04-11 LAB — IRON,TIBC AND FERRITIN PANEL
Ferritin: 7 ng/mL — ABNORMAL LOW (ref 15–150)
Iron Saturation: 3 % — CL (ref 15–55)
Iron: 16 ug/dL — ABNORMAL LOW (ref 27–159)
Total Iron Binding Capacity: 460 ug/dL — ABNORMAL HIGH (ref 250–450)
UIBC: 444 ug/dL — ABNORMAL HIGH (ref 131–425)

## 2024-04-11 LAB — TSH: TSH: 2.31 u[IU]/mL (ref 0.450–4.500)

## 2024-04-11 LAB — HEMOGLOBIN A1C
Est. average glucose Bld gHb Est-mCnc: 94 mg/dL
Hgb A1c MFr Bld: 4.9 % (ref 4.8–5.6)

## 2024-04-11 NOTE — Progress Notes (Signed)
 Established Patient Office Visit  Subjective:  Patient ID: Kelli Hill, female    DOB: [birth year]  Age: 38 y.o. MRN: [id number]  Chief Complaint  Patient presents with   Hyperglycemia    Requesting a refill on Ozempic     Hyperglycemia    Patient is here today for a transfer of care. She previously was seen by Dr. Bevin. She has a history of PCOS, hypertension, hyperglycemia, GERD, iron deficiency anemia, depression, and OCD. Patient is requesting a refill of Ozempic , which she has been on for roughly 2 years. Patient previously struggled with weight gain and PCOS after she had her twins and was unable to lose weight. Patient started on Wegovy  and her PCOS symptoms improved. Patient switched to mounjaro  for affordability and then after switched to Ozempic . Patient has been on maintenance dose of Ozempic  and has been tolerating it well. She is requesting refills and is inquiring whether or not her insurance will approve this weight loss medication.      ROS: as noted in HPI  Objective:     BP 115/72   Pulse 99   Ht 5' 1 (1.549 m)   Wt 157 lb (71.2 kg)   SpO2 100%   BMI 29.66 kg/m  BP Readings from Last 3 Encounters:  04/10/24 115/72  05/12/23 117/84  03/24/23 117/78   Wt Readings from Last 3 Encounters:  04/10/24 157 lb (71.2 kg)  05/12/23 132 lb 12 oz (60.2 kg)  03/24/23 131 lb 12 oz (59.8 kg)      Physical Exam  Constitutional:      Appearance: Normal appearance.  Cardiovascular:     Rate and Rhythm: Normal rate and regular rhythm.     Pulses: Normal pulses.  Pulmonary:     Effort: Pulmonary effort is normal.  Abdominal:     General: Abdomen is flat.  Musculoskeletal:        General: Normal range of motion.     Cervical back: Normal range of motion.  Skin:    General: Skin is warm.  Neurological:     General: No focal deficit present.     Mental Status: She is alert and oriented to person, place, and time. Mental status is at baseline.  Psychiatric:         Mood and Affect: Mood normal.        Behavior: Behavior normal.        Thought Content: Thought content normal.        Judgment: Judgment normal.     The ASCVD Risk score (Arnett DK, et al., 2019) failed to calculate for the following reasons:   The 2019 ASCVD risk score is only valid for ages 48 to 63  Assessment & Plan:  Polycystic ovaries -     Hemoglobin A1c -     TSH -     Wegovy ; Inject 1.7 mg into the skin once a week. Use this dose for 1 month (4 shots) and then increase to next higher dose.  Dispense: 3 mL; Refill: 2  Weight gain -     CBC with Differential/Platelet -     Hemoglobin A1c -     TSH -     Comprehensive metabolic panel with GFR -     Wegovy ; Inject 1.7 mg into the skin once a week. Use this dose for 1 month (4 shots) and then increase to next higher dose.  Dispense: 3 mL; Refill: 2  Anemia, unspecified type -  CBC with Differential/Platelet -     Comprehensive metabolic panel with GFR -     Iron, TIBC and Ferritin Panel  BMI 29.0-29.9,adult -     Wegovy ; Inject 1.7 mg into the skin once a week. Use this dose for 1 month (4 shots) and then increase to next higher dose.  Dispense: 3 mL; Refill: 2  Hx of hyperglycemia  - Wegovy  prescription sent into pharmacy, will start at lower dose and if tolerated consider increasing to maximum dosage in 4-5 weeks  - A1C, CMP, TSH labs drawn today    Anemia- hx of iron deficiency  - CBC, CMP, iron panel labs drawn today   Return in about 5 weeks (around 05/15/2024) for Annual Physical.   Benton LITTIE Gave, PA

## 2024-04-12 MED ORDER — FERROUS FUMARATE 324 (106 FE) MG PO TABS: 1.0000 | ORAL_TABLET | Freq: Every day | ORAL | 0 refills | Status: DC

## 2024-04-13 ENCOUNTER — Other Ambulatory Visit (HOSPITAL_COMMUNITY): Payer: Self-pay

## 2024-04-13 ENCOUNTER — Telehealth: Payer: Self-pay

## 2024-04-13 NOTE — Telephone Encounter (Signed)
 Pharmacy Patient Advocate Encounter  Received notification from [facility] Medicaid that Prior Authorization for Wegovy  1.7mg /0.50ml has been APPROVED from 04/13/24 to 10/10/24. Ran test claim, Copay is $4. This test claim was processed through [facility]- copay amounts may vary at other pharmacies due to pharmacy/plan contracts, or as the patient moves through the different stages of their insurance plan.   PA #/Case ID/Reference #: [id number]

## 2024-04-13 NOTE — Telephone Encounter (Signed)
 Pharmacy Patient Advocate Encounter   Received notification from CoverMyMeds that prior authorization for Wegovy  1.7mg /0.64ml is required/requested.   Insurance verification completed.   The patient is insured through [facility] [place] .   Per test claim: PA required; PA submitted to above mentioned insurance via CoverMyMeds Key/confirmation #/EOC [id number] Status is pending

## 2024-04-25 DIAGNOSIS — Z419 Encounter for procedure for purposes other than remedying health state, unspecified: Secondary | ICD-10-CM | POA: Diagnosis not present

## 2024-05-02 DIAGNOSIS — F4312 Post-traumatic stress disorder, chronic: Secondary | ICD-10-CM | POA: Diagnosis not present

## 2024-05-02 DIAGNOSIS — F422 Mixed obsessional thoughts and acts: Secondary | ICD-10-CM | POA: Diagnosis not present

## 2024-05-02 DIAGNOSIS — F410 Panic disorder [episodic paroxysmal anxiety] without agoraphobia: Secondary | ICD-10-CM | POA: Diagnosis not present

## 2024-05-02 DIAGNOSIS — F411 Generalized anxiety disorder: Secondary | ICD-10-CM | POA: Diagnosis not present

## 2024-05-09 DIAGNOSIS — F410 Panic disorder [episodic paroxysmal anxiety] without agoraphobia: Secondary | ICD-10-CM | POA: Diagnosis not present

## 2024-05-09 DIAGNOSIS — F422 Mixed obsessional thoughts and acts: Secondary | ICD-10-CM | POA: Diagnosis not present

## 2024-05-09 DIAGNOSIS — F4312 Post-traumatic stress disorder, chronic: Secondary | ICD-10-CM | POA: Diagnosis not present

## 2024-05-09 DIAGNOSIS — F411 Generalized anxiety disorder: Secondary | ICD-10-CM | POA: Diagnosis not present

## 2024-05-16 ENCOUNTER — Encounter: Admitting: Urgent Care

## 2024-05-16 ENCOUNTER — Encounter: Payer: Self-pay | Admitting: Sports Medicine

## 2024-05-17 ENCOUNTER — Encounter: Payer: Self-pay | Admitting: Urgent Care

## 2024-05-17 ENCOUNTER — Ambulatory Visit (INDEPENDENT_AMBULATORY_CARE_PROVIDER_SITE_OTHER): Admitting: Urgent Care

## 2024-05-17 VITALS — BP 108/75 | HR 89 | Resp 20 | Ht 61.0 in | Wt 157.0 lb

## 2024-05-17 DIAGNOSIS — F3340 Major depressive disorder, recurrent, in remission, unspecified: Secondary | ICD-10-CM

## 2024-05-17 DIAGNOSIS — B372 Candidiasis of skin and nail: Secondary | ICD-10-CM

## 2024-05-17 DIAGNOSIS — E282 Polycystic ovarian syndrome: Secondary | ICD-10-CM | POA: Diagnosis not present

## 2024-05-17 DIAGNOSIS — R635 Abnormal weight gain: Secondary | ICD-10-CM | POA: Diagnosis not present

## 2024-05-17 DIAGNOSIS — D649 Anemia, unspecified: Secondary | ICD-10-CM | POA: Diagnosis not present

## 2024-05-17 DIAGNOSIS — Z6829 Body mass index (BMI) 29.0-29.9, adult: Secondary | ICD-10-CM

## 2024-05-17 DIAGNOSIS — Z0000 Encounter for general adult medical examination without abnormal findings: Secondary | ICD-10-CM

## 2024-05-17 MED ORDER — WEGOVY 1.7 MG/0.75ML ~~LOC~~ SOAJ: 1.7000 mg | SUBCUTANEOUS | 6 refills | Status: DC

## 2024-05-17 MED ORDER — NYSTATIN 100000 UNIT/GM EX POWD: 1.0000 | Freq: Three times a day (TID) | CUTANEOUS | 5 refills | Status: AC

## 2024-05-17 NOTE — Patient Instructions (Addendum)
 We completed your annual physical today. Please consider updating your vaccines, namely your tetanus.  We will recheck your iron today, consider replacement.  Return in 6months for routine eval related to wegovy .

## 2024-05-17 NOTE — Progress Notes (Signed)
 Complete physical exam  Patient: Kelli Hill   DOB: 1986/02/24   38 y.o. Female  MRN: [id number]  Subjective:    Chief Complaint  Patient presents with   Annual Exam    Kelli Hill is a 38 y.o. female who presents today for a complete physical exam. She reports consuming a high protein, plenty of vegetables diet. Wegovy  has helped with food noise and snacking. The patient does not participate in regular exercise at present. Does walk around the neighborhood. She generally feels fairly well. She reports sleeping fairly well. She does have additional problems to discuss today.   Discussed the use of AI scribe software for clinical note transcription with the patient, who gave verbal consent to proceed.  History of Present Illness   Kelli Hill is a 38 year old female who presents for an annual physical exam.  She declined the tetanus vaccine during this visit, expressing a desire to research more about the vaccine before deciding to receive it. She mentioned feeling very sick after her last flu shot. She wants to research more about the vaccine before deciding to receive it.  She had a Pap smear on May 9th, 2025, due to her mother's history of endometrial cancer. She is vigilant about routine screenings.  She is currently on Wegovy , increased to a dose of 1.7 mg, which has effectively reduced her 'food noise' and cravings. She noted a positive change in her menstrual cycle since resuming Wegovy , with her last period lasting three days with manageable bleeding.  She has not started taking ferrous fumarate  due to previous adverse effects. She consumes a high-protein diet with lots of vegetables and some carbohydrates, including bread. She sometimes skips lunch and describes herself as a 'grazer.'  She engages in walking for physical activity and plans to start going to the gym with a friend. She finds it challenging to find time due to family responsibilities, including caring for her  six-year-old twins with level one autism.  She experiences difficulty falling asleep at night, attributing it to phone use. She loves to sleep but feels tired during the day due to poor sleep hygiene.  She experiences situational and seasonal depression, feeling the effects as the days get colder. She is not a fan of winter and feels the onset of seasonal affective disorder.  She missed her last dental appointment for a cavity filling but is due for a vision screening. She has a history of contact dermatitis on her hands and an itchy rash under her breast, for which she was prescribed a powder that she found effective.       Most recent fall risk assessment:    04/10/2024    4:35 PM  Fall Risk   Falls in the past year? 0  Number falls in past yr: 0  Injury with Fall? 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   Twin pregnancy 01/23/2021   Uterine leiomyoma 01/23/2021   Vitamin D deficiency 01/23/2021   Weight gain 01/23/2021   Coughing 12/24/2020   Polycystic ovaries 06/24/2020   Microcytic anemia 06/24/2020   Family history of uterine cancer 06/24/2020   Chronic bilateral thoracic back pain 06/24/2020   Breast pain, left 06/24/2020   Bruxism 10/31/2019   Referred ear pain, bilateral 10/31/2019   Cesarean delivery delivered 02/09/2018   Pregnancy 02/06/2018   Preterm uterine contractions in third trimester,  antepartum 01/18/2018   [redacted] weeks gestation of pregnancy 12/20/2017   Persistent proteinuria 12/20/2017   Diet controlled gestational diabetes mellitus (GDM) in third trimester 12/13/2017   Gastroesophageal reflux disease 05/03/2017   Postnasal drip 01/25/2017   Chronic maxillary sinusitis 09/24/2016   Absence of menstruation 02/18/2013   Anxiety state 02/18/2013   Breast mass 02/18/2013   Vaginitis and vulvovaginitis 02/18/2013   Depressive disorder 02/18/2013   Excessive and frequent menstruation 02/18/2013   Screening examination for sexually transmitted disease 02/18/2013   Surveillance for birth control, intrauterine device 02/18/2013   Negative pregnancy test 10/06/2012   History reviewed. No pertinent past medical history. History reviewed. No pertinent surgical history. Social History   Tobacco Use   Smoking status: Former   Smokeless tobacco: Never  Advertising account planner   Vaping status: Never Used  Substance Use Topics   Alcohol use: Never   Drug use: Never      Patient Care Team: Lowella Benton CROME, PA as PCP - General (Physician Assistant)   Outpatient Medications Prior to Visit  Medication Sig   Ferrous Fumarate  (HEMOCYTE - 106 MG FE) 324 (106 Fe) MG TABS tablet Take 1 tablet (106 mg of iron total) by mouth daily.   fluticasone  (FLONASE ) 50 MCG/ACT nasal spray Place 1 spray into both nostrils daily.   ipratropium (ATROVENT ) 0.06 % nasal spray Place 2 sprays into both nostrils 3 (three) times daily. As needed for nasal congestion, runny nose   triamcinolone  cream (KENALOG ) 0.1 % Apply 1 Application topically 2 (two) times daily. Apply to affected area(s) twice daily , do not apply to face.   [DISCONTINUED] WEGOVY  1.7 MG/0.75ML SOAJ Inject 1.7 mg into the skin once a week. Use this dose for 1 month (4 shots) and then increase to next higher dose.   cetirizine  (ZYRTEC  ALLERGY) 10 MG tablet Take 1 tablet (10 mg total) by mouth at bedtime.   No facility-administered medications  prior to visit.    ROS Complete 12 point ROS performed with all pertinent positives listed in HPI      Objective:     BP 108/75 (BP Location: Right Arm, Patient Position: Sitting, Cuff Size: Normal)   Pulse 89   Resp 20   Ht 5' 1 (1.549 m)   Wt 157 lb (71.2 kg)   SpO2 99%   BMI 29.66 kg/m  BP Readings from Last 3 Encounters:  05/17/24 108/75  04/10/24 115/72  05/12/23 117/84   Wt Readings from Last 3 Encounters:  05/17/24 157 lb (71.2 kg)  04/10/24 157 lb (71.2 kg)  05/12/23 132 lb 12 oz (60.2 kg)      Physical Exam Vitals and nursing note reviewed.  Constitutional:      General: She is not in acute distress.    Appearance: Normal appearance. She is not ill-appearing, toxic-appearing or diaphoretic.  HENT:     Head: Normocephalic and atraumatic.     Right Ear: Tympanic membrane,  ear canal and external ear normal. There is no impacted cerumen.     Left Ear: Tympanic membrane, ear canal and external ear normal. There is no impacted cerumen.     Nose: Nose normal.     Mouth/Throat:     Mouth: Mucous membranes are moist.     Pharynx: Oropharynx is clear. No oropharyngeal exudate or posterior oropharyngeal erythema.  Eyes:     General: No scleral icterus.       Right eye: No discharge.        Left eye: No discharge.     Extraocular Movements: Extraocular movements intact.     Pupils: Pupils are equal, round, and reactive to light.  Neck:     Thyroid : No thyroid  mass, thyromegaly or thyroid  tenderness.  Cardiovascular:     Rate and Rhythm: Normal rate and regular rhythm.     Pulses: Normal pulses.     Heart sounds: No murmur heard. Pulmonary:     Effort: Pulmonary effort is normal. No respiratory distress.     Breath sounds: Normal breath sounds. No stridor. No wheezing or rhonchi.  Abdominal:     General: Abdomen is flat. Bowel sounds are normal. There is no distension.     Palpations: Abdomen is soft. There is no mass.     Tenderness: There is no abdominal  tenderness. There is no guarding.  Musculoskeletal:     Cervical back: Normal range of motion and neck supple. No rigidity or tenderness.     Right lower leg: No edema.     Left lower leg: No edema.  Lymphadenopathy:     Cervical: No cervical adenopathy.  Skin:    General: Skin is warm and dry.     Coloration: Skin is not jaundiced.     Findings: No bruising, erythema or rash.  Neurological:     General: No focal deficit present.     Mental Status: She is alert and oriented to person, place, and time.     Sensory: No sensory deficit.     Motor: No weakness.  Psychiatric:        Mood and Affect: Mood normal.        Behavior: Behavior normal.       Last CBC Lab Results  Component Value Date   WBC 5.5 04/10/2024   HGB 10.3 (L) 04/10/2024   HCT 35.1 04/10/2024   MCV 76 (L) 04/10/2024   MCH 22.2 (L) 04/10/2024   RDW 15.2 04/10/2024   PLT 340 04/10/2024   Last metabolic panel Lab Results  Component Value Date   GLUCOSE 80 04/10/2024   NA 138 04/10/2024   K 4.4 04/10/2024   CL 104 04/10/2024   CO2 20 04/10/2024   BUN 17 04/10/2024   CREATININE 1.02 (H) 04/10/2024   EGFR 73 04/10/2024   CALCIUM 9.4 04/10/2024   PROT 6.9 04/10/2024   ALBUMIN 4.1 04/10/2024   LABGLOB 2.8 04/10/2024   BILITOT 0.3 04/10/2024   ALKPHOS 78 04/10/2024   AST 16 04/10/2024   ALT 12 04/10/2024   Last lipids Lab Results  Component Value Date   CHOL 161 05/12/2023   HDL 71 05/12/2023   LDLCALC 76 05/12/2023   TRIG 73 05/12/2023   CHOLHDL 2.3 05/12/2023   Last hemoglobin A1c Lab Results  Component Value Date   HGBA1C 4.9 04/10/2024   Last thyroid  functions Lab Results  Component Value Date   TSH 2.310 04/10/2024   Last vitamin D No results found for: 25OHVITD2,  25OHVITD3, VD25OH Last vitamin B12 and Folate No results found for: VITAMINB12, FOLATE      Assessment & Plan:    Routine Health Maintenance and Physical Exam   There is no immunization history on file  for this patient.  Health Maintenance  Topic Date Due   DTaP/Tdap/Td (1 - Tdap) Never done   Hepatitis B Vaccines 19-59 Average Risk (1 of 3 - 19+ 3-dose series) Never done   HPV VACCINES (1 - 3-dose SCDM series) Never done   INFLUENZA VACCINE  Never done   COVID-19 Vaccine (1 - 2024-25 season) Never done   Cervical Cancer Screening (HPV/Pap Cotest)  01/21/2027   Hepatitis C Screening  Completed   HIV Screening  Completed   Pneumococcal Vaccine  Aged Out   Meningococcal B Vaccine  Aged Out    Discussed health benefits of physical activity, and encouraged her to engage in regular exercise appropriate for her age and condition.  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  Assessment and Plan    Adult Wellness Visit Routine wellness visit focused on preventive care. Declined tetanus vaccination due to past adverse reaction. Discussed tetanus vaccination importance, Pap smear history, and seasonal affective disorder. - Encourage tetanus vaccination, currently deferred. - Encourage regular physical activity, aiming for 30 minutes, 3-4 times a week. - Recommend turning off electronics one hour before bed to improve sleep hygiene. - Consider use of a SAD light for seasonal affective  disorder. - Order lipid panel to assess cholesterol levels.  Polycystic Ovarian Syndrome Wegovy  improved PCOS symptoms, including reduced food cravings and improved menstrual cycle. - Continue Wegovy  at current dose.  Obesity, BMI 29.0-29.9 Wegovy  aided weight management and reduced food cravings. Discussed portion control and balanced diet. - Continue Wegovy  at current dose. - Encourage balanced diet and regular physical activity.  Candidiasis of Skin Fold Intermittent itching and rash under skin folds, prefers antifungal powder for relief. - Prescribe antifungal powder for use as needed, up to three times a day.  Anemia Recommended ferrous fumarate  not started due to adverse effects. Discussed dietary iron sources. - Recheck iron panel to assess current status.  Depression, Recurrent, in Remission Reports situational and seasonal depression. Discussed physical activity and SAD light for symptom management. - Encourage regular physical activity to improve mood. - Consider use of a SAD light for seasonal affective disorder.          Benton LITTIE Gave, PA

## 2024-05-18 ENCOUNTER — Ambulatory Visit: Payer: Self-pay | Admitting: Urgent Care

## 2024-05-18 DIAGNOSIS — E282 Polycystic ovarian syndrome: Secondary | ICD-10-CM

## 2024-05-18 LAB — CBC WITH DIFFERENTIAL/PLATELET
Basophils Absolute: 0 x10E3/uL (ref 0.0–0.2)
Basos: 1 %
EOS (ABSOLUTE): 0.1 x10E3/uL (ref 0.0–0.4)
Eos: 3 %
Hematocrit: 36.9 % (ref 34.0–46.6)
Hemoglobin: 10.7 g/dL — ABNORMAL LOW (ref 11.1–15.9)
Immature Grans (Abs): 0 x10E3/uL (ref 0.0–0.1)
Immature Granulocytes: 0 %
Lymphocytes Absolute: 1.7 x10E3/uL (ref 0.7–3.1)
Lymphs: 37 %
MCH: 22 pg — ABNORMAL LOW (ref 26.6–33.0)
MCHC: 29 g/dL — ABNORMAL LOW (ref 31.5–35.7)
MCV: 76 fL — ABNORMAL LOW (ref 79–97)
Monocytes Absolute: 0.4 x10E3/uL (ref 0.1–0.9)
Monocytes: 9 %
Neutrophils Absolute: 2.3 x10E3/uL (ref 1.4–7.0)
Neutrophils: 50 %
Platelets: 307 x10E3/uL (ref 150–450)
RBC: 4.86 x10E6/uL (ref 3.77–5.28)
RDW: 15.2 % (ref 11.7–15.4)
WBC: 4.5 x10E3/uL (ref 3.4–10.8)

## 2024-05-18 LAB — IRON AND TIBC
Iron Saturation: 8 % — CL (ref 15–55)
Iron: 35 ug/dL (ref 27–159)
Total Iron Binding Capacity: 447 ug/dL (ref 250–450)
UIBC: 412 ug/dL (ref 131–425)

## 2024-05-18 LAB — FERRITIN: Ferritin: 6 ng/mL — ABNORMAL LOW (ref 15–150)

## 2024-05-18 LAB — LIPID PANEL
Chol/HDL Ratio: 2.6 ratio (ref 0.0–4.4)
Cholesterol, Total: 203 mg/dL — ABNORMAL HIGH (ref 100–199)
HDL: 79 mg/dL (ref >39)
LDL Chol Calc (NIH): 107 mg/dL — ABNORMAL HIGH (ref 0–99)
Triglycerides: 94 mg/dL (ref 0–149)
VLDL Cholesterol Cal: 17 mg/dL (ref 5–40)

## 2024-05-23 DIAGNOSIS — F411 Generalized anxiety disorder: Secondary | ICD-10-CM | POA: Diagnosis not present

## 2024-05-23 DIAGNOSIS — F410 Panic disorder [episodic paroxysmal anxiety] without agoraphobia: Secondary | ICD-10-CM | POA: Diagnosis not present

## 2024-05-23 DIAGNOSIS — F422 Mixed obsessional thoughts and acts: Secondary | ICD-10-CM | POA: Diagnosis not present

## 2024-05-23 DIAGNOSIS — F4312 Post-traumatic stress disorder, chronic: Secondary | ICD-10-CM | POA: Diagnosis not present

## 2024-05-26 DIAGNOSIS — Z419 Encounter for procedure for purposes other than remedying health state, unspecified: Secondary | ICD-10-CM | POA: Diagnosis not present

## 2024-05-30 DIAGNOSIS — F4312 Post-traumatic stress disorder, chronic: Secondary | ICD-10-CM | POA: Diagnosis not present

## 2024-05-30 DIAGNOSIS — F411 Generalized anxiety disorder: Secondary | ICD-10-CM | POA: Diagnosis not present

## 2024-05-30 DIAGNOSIS — F410 Panic disorder [episodic paroxysmal anxiety] without agoraphobia: Secondary | ICD-10-CM | POA: Diagnosis not present

## 2024-05-30 DIAGNOSIS — F422 Mixed obsessional thoughts and acts: Secondary | ICD-10-CM | POA: Diagnosis not present

## 2024-06-06 DIAGNOSIS — F411 Generalized anxiety disorder: Secondary | ICD-10-CM | POA: Diagnosis not present

## 2024-06-06 DIAGNOSIS — F4312 Post-traumatic stress disorder, chronic: Secondary | ICD-10-CM | POA: Diagnosis not present

## 2024-06-06 DIAGNOSIS — F410 Panic disorder [episodic paroxysmal anxiety] without agoraphobia: Secondary | ICD-10-CM | POA: Diagnosis not present

## 2024-06-06 DIAGNOSIS — F422 Mixed obsessional thoughts and acts: Secondary | ICD-10-CM | POA: Diagnosis not present

## 2024-06-12 MED ORDER — METFORMIN HCL ER 500 MG PO TB24: 500.0000 mg | ORAL_TABLET | Freq: Every day | ORAL | 2 refills | Status: DC

## 2024-06-12 NOTE — Telephone Encounter (Signed)

## 2024-06-20 DIAGNOSIS — F4312 Post-traumatic stress disorder, chronic: Secondary | ICD-10-CM | POA: Diagnosis not present

## 2024-06-20 DIAGNOSIS — F411 Generalized anxiety disorder: Secondary | ICD-10-CM | POA: Diagnosis not present

## 2024-06-20 DIAGNOSIS — F410 Panic disorder [episodic paroxysmal anxiety] without agoraphobia: Secondary | ICD-10-CM | POA: Diagnosis not present

## 2024-06-20 DIAGNOSIS — F422 Mixed obsessional thoughts and acts: Secondary | ICD-10-CM | POA: Diagnosis not present

## 2024-06-23 ENCOUNTER — Telehealth: Payer: Self-pay

## 2024-06-23 ENCOUNTER — Other Ambulatory Visit: Payer: Self-pay

## 2024-06-23 NOTE — Telephone Encounter (Signed)
 Pharmacy Patient Advocate Encounter   Received notification from Onbase that prior authorization for Wegovy  1.7 mg/0.75 ml auto injectors is required/requested.   Insurance verification completed.   The patient is insured through [employer].   Per test claim: Effective October 1st, Medicaid will discontinue coverage of GLP1 medications for weight loss (such as Wegovy  and Zepbound ), unless the patient has a documented history of a heart attack or stroke. Zepbound  will continue to be covered only for patients with moderate to severe sleep apnea (AHI 15-30) and a BMI greater than 40. Because of this change, the prior authorization team will not be submitting new PA requests for GLP1 medications prescribed for weight loss, as patients will be unable to continue therapy under Medicaid coverage.

## 2024-06-27 DIAGNOSIS — F422 Mixed obsessional thoughts and acts: Secondary | ICD-10-CM | POA: Diagnosis not present

## 2024-06-27 DIAGNOSIS — F410 Panic disorder [episodic paroxysmal anxiety] without agoraphobia: Secondary | ICD-10-CM | POA: Diagnosis not present

## 2024-06-27 DIAGNOSIS — F411 Generalized anxiety disorder: Secondary | ICD-10-CM | POA: Diagnosis not present

## 2024-06-27 DIAGNOSIS — F4312 Post-traumatic stress disorder, chronic: Secondary | ICD-10-CM | POA: Diagnosis not present

## 2024-07-04 DIAGNOSIS — F410 Panic disorder [episodic paroxysmal anxiety] without agoraphobia: Secondary | ICD-10-CM | POA: Diagnosis not present

## 2024-07-04 DIAGNOSIS — F411 Generalized anxiety disorder: Secondary | ICD-10-CM | POA: Diagnosis not present

## 2024-07-04 DIAGNOSIS — F4312 Post-traumatic stress disorder, chronic: Secondary | ICD-10-CM | POA: Diagnosis not present

## 2024-07-04 DIAGNOSIS — F422 Mixed obsessional thoughts and acts: Secondary | ICD-10-CM | POA: Diagnosis not present

## 2024-07-18 DIAGNOSIS — F422 Mixed obsessional thoughts and acts: Secondary | ICD-10-CM | POA: Diagnosis not present

## 2024-07-18 DIAGNOSIS — F4312 Post-traumatic stress disorder, chronic: Secondary | ICD-10-CM | POA: Diagnosis not present

## 2024-07-18 DIAGNOSIS — F410 Panic disorder [episodic paroxysmal anxiety] without agoraphobia: Secondary | ICD-10-CM | POA: Diagnosis not present

## 2024-07-18 DIAGNOSIS — F411 Generalized anxiety disorder: Secondary | ICD-10-CM | POA: Diagnosis not present

## 2024-08-01 DIAGNOSIS — F411 Generalized anxiety disorder: Secondary | ICD-10-CM | POA: Diagnosis not present

## 2024-08-01 DIAGNOSIS — F4312 Post-traumatic stress disorder, chronic: Secondary | ICD-10-CM | POA: Diagnosis not present

## 2024-08-01 DIAGNOSIS — F422 Mixed obsessional thoughts and acts: Secondary | ICD-10-CM | POA: Diagnosis not present

## 2024-08-01 DIAGNOSIS — F410 Panic disorder [episodic paroxysmal anxiety] without agoraphobia: Secondary | ICD-10-CM | POA: Diagnosis not present

## 2024-08-15 DIAGNOSIS — F411 Generalized anxiety disorder: Secondary | ICD-10-CM | POA: Diagnosis not present

## 2024-08-15 DIAGNOSIS — F422 Mixed obsessional thoughts and acts: Secondary | ICD-10-CM | POA: Diagnosis not present

## 2024-08-15 DIAGNOSIS — F4312 Post-traumatic stress disorder, chronic: Secondary | ICD-10-CM | POA: Diagnosis not present

## 2024-08-15 DIAGNOSIS — F410 Panic disorder [episodic paroxysmal anxiety] without agoraphobia: Secondary | ICD-10-CM | POA: Diagnosis not present

## 2024-08-21 ENCOUNTER — Ambulatory Visit: Payer: Self-pay

## 2024-08-21 NOTE — Telephone Encounter (Signed)
 FYI Only or Action Required?: FYI only for provider: appointment scheduled on 12/9.  Patient was last seen in primary care on 05/17/2024 by Lowella Benton CROME, PA.  Called Nurse Triage reporting Urinary Frequency.  Symptoms began about a month ago.  Interventions attempted: Rest, hydration, or home remedies.  Symptoms are: gradually worsening.  Triage Disposition: See Physician Within 24 Hours  Patient/caregiver understands and will follow disposition?: Yes  Copied from CRM #[id number]. Topic: Clinical - Red Word Triage >> Aug 21, 2024  3:12 PM Mercer PEDLAR wrote: Red Word that prompted transfer to Nurse Triage: Frequent urination, dry mouth and throat, symptoms have developed over the past month. would like appointment for labs.forehead patches looking darker Reason for Disposition  Urinating more frequently than usual (i.e., frequency) OR new-onset of the feeling of an urgent need to urinate (i.e., urgency)  Answer Assessment - Initial Assessment Questions Urinating frequently- normal amoutns- will sit for a little longer and be able to go again. Did have Gestational Diabetes- does not have monitor. Denies dizziness. Has dry mouth. Denies vision changes. Periods are regular- due to start next week. Doesn't ever really know if she has UTI. Doesn't get burning. Has had more frequent yeast infections- boric acid to treat.   Appt with PCP office tomorrow to assess. ED precautions advised.  1. SYMPTOM: What's the main symptom you're concerned about? (e.g., frequency, incontinence)     Increased Frequency- will sit 2. ONSET: When did the  frequency start?     A month ago 3. PAIN: Is there any pain? If Yes, ask: How bad is it? (Scale: 1-10; mild, moderate, severe)     Denies  4. CAUSE: What do you think is causing the symptoms?     Unsure having  5. OTHER SYMPTOMS: Do you have any other symptoms? (e.g., blood in urine, fever, flank pain, pain with urination)     Dry mouth  6.  PREGNANCY: Is there any chance you are pregnant? When was your last menstrual period?     LMP on 11/17  Protocols used: Urinary Symptoms-A-AH

## 2024-08-22 ENCOUNTER — Encounter: Payer: Self-pay | Admitting: Family Medicine

## 2024-08-22 ENCOUNTER — Ambulatory Visit: Admitting: Family Medicine

## 2024-08-22 VITALS — BP 124/84 | HR 92 | Ht 61.0 in | Wt 167.0 lb

## 2024-08-22 DIAGNOSIS — D509 Iron deficiency anemia, unspecified: Secondary | ICD-10-CM | POA: Diagnosis not present

## 2024-08-22 DIAGNOSIS — R631 Polydipsia: Secondary | ICD-10-CM

## 2024-08-22 DIAGNOSIS — R739 Hyperglycemia, unspecified: Secondary | ICD-10-CM

## 2024-08-22 DIAGNOSIS — R350 Frequency of micturition: Secondary | ICD-10-CM | POA: Insufficient documentation

## 2024-08-22 DIAGNOSIS — R0600 Dyspnea, unspecified: Secondary | ICD-10-CM | POA: Diagnosis not present

## 2024-08-22 DIAGNOSIS — E559 Vitamin D deficiency, unspecified: Secondary | ICD-10-CM | POA: Diagnosis not present

## 2024-08-22 NOTE — Assessment & Plan Note (Signed)
 She is not taking iron supplement regularly.  She is having some dyspnea that she describes as different from her anxiety.  Checking CBC and iron panel.

## 2024-08-22 NOTE — Assessment & Plan Note (Signed)
 Normal O2 sats.  Exam is unremarkable.  Updating labs.

## 2024-08-22 NOTE — Assessment & Plan Note (Signed)
 Update A1c.  Checking urinalysis.  She does tend to sit on the toilet for long periods of time for bowel movements but denies constipation.  Question possible pelvic floor disorder.

## 2024-08-22 NOTE — Progress Notes (Signed)
 Kelli Hill - 38 y.o. female MRN [id number]  Date of birth: 1986/06/27  Subjective Chief Complaint  Patient presents with   Urinary Frequency   Polydipsia   Shortness of Breath   Palpitations   Tingling    Toes only     HPI Kelli Hill is a 38 y.o. female here today with complaint of urinary frequency and urgency, dry mouth and throat.  Urinary frequency started about 2 months ago.  She denies back or flank pain, blood in her urine or nausea.  She does report that she will feel the toilet for long peers of time when having a bowel movement and tends to urinate several times while sitting there.  She denies any issues with constipation.  She had previously been on Ozempic /Wegovy  and A1c was 4.9 while taking this.  She did have gestational diabetes previously.  She also reports symptoms of OCD associated with anxiety.   She is working with a paramedic.    She has experienced intermittent episodes of dyspnea.  This does not feel like her typical anxiety or panic attacks.  She does have history of iron deficiency anemia but is not taking her iron supplement regularly.  She also mention intermittent tingling in her toes.  This does not always involve the same toes.  I told you what they are for like this yesterday Carlo come back  ROS:  A comprehensive ROS was completed and negative except as noted per HPI  Allergies  Allergen Reactions   Iodine Nausea And Vomiting    IV and Oral Contrast Iodine only. NO ISSUES WITH BETADINE   Contrast Media [Iodinated Contrast Media] Nausea And Vomiting    History reviewed. No pertinent past medical history.  History reviewed. No pertinent surgical history.  Social History   Socioeconomic History   Marital status: Significant Other    Spouse name: Not on file   Number of children: Not on file   Years of education: Not on file   Highest education level: Some college, no degree  Occupational History   Not on file  Tobacco Use   Smoking status:  Former   Smokeless tobacco: Never  Vaping Use   Vaping status: Never Used  Substance and Sexual Activity   Alcohol use: Never   Drug use: Never   Sexual activity: Yes    Partners: Female, Female    Birth control/protection: None  Other Topics Concern   Not on file  Social History Narrative   Not on file   Social Drivers of Health   Financial Resource Strain: High Risk (08/21/2024)   Overall Financial Resource Strain (CARDIA)    Difficulty of Paying Living Expenses: Hard  Food Insecurity: Food Insecurity Present (08/21/2024)   Hunger Vital Sign    Worried About Running Out of Food in the Last Year: Sometimes true    Ran Out of Food in the Last Year: Never true  Transportation Needs: No Transportation Needs (08/21/2024)   PRAPARE - Administrator, Civil Service (Medical): No    Lack of Transportation (Non-Medical): No  Physical Activity: Insufficiently Active (08/21/2024)   Exercise Vital Sign    Days of Exercise per Week: 3 days    Minutes of Exercise per Session: 10 min  Stress: Stress Concern Present (08/21/2024)   [employer] of Occupational Health - Occupational Stress Questionnaire    Feeling of Stress: To some extent  Social Connections: Unknown (08/21/2024)   Social Connection and Isolation Panel    Frequency  of Communication with Friends and Family: More than three times a week    Frequency of Social Gatherings with Friends and Family: Once a week    Attends Religious Services: Patient declined    Database Administrator or Organizations: Yes    Attends Banker Meetings: Never    Marital Status: Patient declined    Family History  Problem Relation Age of Onset   Endometrial cancer Mother    Lung cancer Mother    Kidney disease Mother        stage 3   Hypertension Mother    Ovarian cancer Mother    Diabetes Maternal Grandmother    Diabetes Maternal Grandfather    Skin cancer Maternal Grandfather     Health Maintenance  Topic Date  Due   COVID-19 Vaccine (1) Never done   DTaP/Tdap/Td (1 - Tdap) Never done   Hepatitis B Vaccines 19-59 Average Risk (1 of 3 - 19+ 3-dose series) Never done   HPV VACCINES (1 - Risk 3-dose SCDM series) Never done   Influenza Vaccine  12/12/2024 (Originally 04/14/2024)   Cervical Cancer Screening (HPV/Pap Cotest)  01/21/2027   Hepatitis C Screening  Completed   HIV Screening  Completed   Pneumococcal Vaccine  Aged Out   Meningococcal B Vaccine  Aged Out     ----------------------------------------------------------------------------------------------------------------------------------------------------------------------------------------------------------------- Physical Exam BP 124/84 (BP Location: Left Arm, Patient Position: Sitting, Cuff Size: Normal)   Pulse 92   Ht 5' 1 (1.549 m)   Wt 167 lb (75.8 kg)   SpO2 100%   BMI 31.55 kg/m   Physical Exam Constitutional:      Appearance: She is well-developed.  HENT:     Head: Normocephalic and atraumatic.  Eyes:     General: No scleral icterus. Cardiovascular:     Rate and Rhythm: Normal rate and regular rhythm.  Pulmonary:     Effort: Pulmonary effort is normal.     Breath sounds: Normal breath sounds.  Musculoskeletal:     Cervical back: Neck supple.  Neurological:     Mental Status: She is alert.  Psychiatric:        Mood and Affect: Mood normal.        Behavior: Behavior normal.     ------------------------------------------------------------------------------------------------------------------------------------------------------------------------------------------------------------------- Assessment and Plan  Urinary frequency Update A1c.  Checking urinalysis.  She does tend to sit on the toilet for long periods of time for bowel movements but denies constipation.  Question possible pelvic floor disorder.  Microcytic anemia She is not taking iron supplement regularly.  She is having some dyspnea that she  describes as different from her anxiety.  Checking CBC and iron panel.  Dyspnea Normal O2 sats.  Exam is unremarkable.  Updating labs.   No orders of the defined types were placed in this encounter.   Return in about 3 weeks (around 09/12/2024) for F/u Dyspnea, increased thirst/urination.

## 2024-08-23 LAB — URINALYSIS, ROUTINE W REFLEX MICROSCOPIC
Bilirubin, UA: NEGATIVE
Glucose, UA: NEGATIVE
Ketones, UA: NEGATIVE
Leukocytes,UA: NEGATIVE
Nitrite, UA: NEGATIVE
Specific Gravity, UA: 1.022 (ref 1.005–1.030)
Urobilinogen, Ur: 0.2 mg/dL (ref 0.2–1.0)
pH, UA: 5.5 (ref 5.0–7.5)

## 2024-08-23 LAB — CBC WITH DIFFERENTIAL/PLATELET
Basophils Absolute: 0 x10E3/uL (ref 0.0–0.2)
Basos: 1 %
EOS (ABSOLUTE): 0.1 x10E3/uL (ref 0.0–0.4)
Eos: 2 %
Hematocrit: 37.9 % (ref 34.0–46.6)
Hemoglobin: 11.6 g/dL (ref 11.1–15.9)
Immature Grans (Abs): 0 x10E3/uL (ref 0.0–0.1)
Immature Granulocytes: 0 %
Lymphocytes Absolute: 1.6 x10E3/uL (ref 0.7–3.1)
Lymphs: 26 %
MCH: 23.6 pg — ABNORMAL LOW (ref 26.6–33.0)
MCHC: 30.6 g/dL — ABNORMAL LOW (ref 31.5–35.7)
MCV: 77 fL — ABNORMAL LOW (ref 79–97)
Monocytes Absolute: 0.5 x10E3/uL (ref 0.1–0.9)
Monocytes: 9 %
Neutrophils Absolute: 3.7 x10E3/uL (ref 1.4–7.0)
Neutrophils: 62 %
Platelets: 382 x10E3/uL (ref 150–450)
RBC: 4.92 x10E6/uL (ref 3.77–5.28)
RDW: 14.6 % (ref 11.7–15.4)
WBC: 6 x10E3/uL (ref 3.4–10.8)

## 2024-08-23 LAB — CMP14+EGFR
ALT: 28 IU/L (ref 0–32)
AST: 22 IU/L (ref 0–40)
Albumin: 4.3 g/dL (ref 3.9–4.9)
Alkaline Phosphatase: 87 IU/L (ref 41–116)
BUN/Creatinine Ratio: 18 (ref 9–23)
BUN: 19 mg/dL (ref 6–20)
Bilirubin Total: 0.4 mg/dL (ref 0.0–1.2)
CO2: 21 mmol/L (ref 20–29)
Calcium: 9.8 mg/dL (ref 8.7–10.2)
Chloride: 99 mmol/L (ref 96–106)
Creatinine, Ser: 1.08 mg/dL — ABNORMAL HIGH (ref 0.57–1.00)
Globulin, Total: 2.6 g/dL (ref 1.5–4.5)
Glucose: 81 mg/dL (ref 70–99)
Potassium: 4.3 mmol/L (ref 3.5–5.2)
Sodium: 136 mmol/L (ref 134–144)
Total Protein: 6.9 g/dL (ref 6.0–8.5)
eGFR: 68 mL/min/1.73 (ref >59)

## 2024-08-23 LAB — IRON,TIBC AND FERRITIN PANEL
Ferritin: 8 ng/mL — ABNORMAL LOW (ref 15–150)
Iron Saturation: 5 % — CL (ref 15–55)
Iron: 28 ug/dL (ref 27–159)
Total Iron Binding Capacity: 514 ug/dL — ABNORMAL HIGH (ref 250–450)
UIBC: 486 ug/dL — ABNORMAL HIGH (ref 131–425)

## 2024-08-23 LAB — MICROSCOPIC EXAMINATION: Casts: NONE SEEN /LPF

## 2024-08-23 LAB — VITAMIN D 25 HYDROXY (VIT D DEFICIENCY, FRACTURES): Vit D, 25-Hydroxy: 12.1 ng/mL — ABNORMAL LOW (ref 30.0–100.0)

## 2024-08-23 LAB — HEMOGLOBIN A1C
Est. average glucose Bld gHb Est-mCnc: 108 mg/dL
Hgb A1c MFr Bld: 5.4 % (ref 4.8–5.6)

## 2024-08-23 LAB — TSH: TSH: 2.9 u[IU]/mL (ref 0.450–4.500)

## 2024-08-24 ENCOUNTER — Ambulatory Visit: Payer: Self-pay | Admitting: Family Medicine

## 2024-08-24 LAB — URINE CULTURE: Organism ID, Bacteria: NO GROWTH

## 2024-08-24 MED ORDER — VITAMIN D (ERGOCALCIFEROL) 1.25 MG (50000 UNIT) PO CAPS: 50000.0000 [IU] | ORAL_CAPSULE | ORAL | 0 refills | Status: AC

## 2024-08-25 DIAGNOSIS — Z419 Encounter for procedure for purposes other than remedying health state, unspecified: Secondary | ICD-10-CM | POA: Diagnosis not present

## 2024-08-28 ENCOUNTER — Other Ambulatory Visit (HOSPITAL_COMMUNITY): Payer: Self-pay

## 2024-08-28 NOTE — Telephone Encounter (Signed)
-----   Message from Laneta DELENA Sieving sent at 08/28/2024  1:44 PM EST ----- Regarding: RE: PA request from pharmacy Pharmacy Patient Advocate Encounter  Effective October 1st, [place] discontinued [place] of GLP1 medications for weight loss (such as Wegovy  and Zepbound ), unless the patient has a documented history of a heart attack or stroke. Zepbound  will continue to be covered only for patients with moderate to severe sleep apnea (AHI 15-30) and a BMI greater than 40. Because of this change, the prior authorization team will not be submitting new PA requests for GLP1 medications prescribed for weight loss, as patients will be unable to continue therapy under Medicaid coverage.   The prior authorization is not submitted due to medicaid no longer covers Wegovy  or Zepbound  for weight loss only. ----- Message ----- From: Oley Chiquita CROME, CMA Sent: 08/28/2024   1:23 PM EST To: Rx Prior Auth Team Subject: PA request from pharmacy

## 2024-09-12 DIAGNOSIS — F4312 Post-traumatic stress disorder, chronic: Secondary | ICD-10-CM | POA: Diagnosis not present

## 2024-09-12 DIAGNOSIS — F411 Generalized anxiety disorder: Secondary | ICD-10-CM | POA: Diagnosis not present

## 2024-09-12 DIAGNOSIS — F410 Panic disorder [episodic paroxysmal anxiety] without agoraphobia: Secondary | ICD-10-CM | POA: Diagnosis not present

## 2024-09-12 DIAGNOSIS — F422 Mixed obsessional thoughts and acts: Secondary | ICD-10-CM | POA: Diagnosis not present

## 2024-09-20 ENCOUNTER — Ambulatory Visit (INDEPENDENT_AMBULATORY_CARE_PROVIDER_SITE_OTHER): Admitting: Urgent Care

## 2024-09-20 VITALS — BP 114/75 | HR 92 | Ht 61.0 in | Wt 173.2 lb

## 2024-09-20 DIAGNOSIS — Z6832 Body mass index (BMI) 32.0-32.9, adult: Secondary | ICD-10-CM

## 2024-09-20 DIAGNOSIS — D649 Anemia, unspecified: Secondary | ICD-10-CM | POA: Diagnosis not present

## 2024-09-20 DIAGNOSIS — F4321 Adjustment disorder with depressed mood: Secondary | ICD-10-CM

## 2024-09-20 DIAGNOSIS — R635 Abnormal weight gain: Secondary | ICD-10-CM

## 2024-09-20 DIAGNOSIS — N289 Disorder of kidney and ureter, unspecified: Secondary | ICD-10-CM | POA: Diagnosis not present

## 2024-09-20 DIAGNOSIS — R350 Frequency of micturition: Secondary | ICD-10-CM | POA: Diagnosis not present

## 2024-09-20 DIAGNOSIS — E559 Vitamin D deficiency, unspecified: Secondary | ICD-10-CM | POA: Diagnosis not present

## 2024-09-20 MED ORDER — WEGOVY 0.25 MG/0.5ML ~~LOC~~ SOAJ: 0.2500 mg | SUBCUTANEOUS | 0 refills | Status: AC

## 2024-09-20 NOTE — Patient Instructions (Addendum)
 Please look up buspar/ buspirone. I think this will help with your stress/ anxiety levels.  I have called in the Wegovy  for you.  Please start back at 0.25mg  weekly, and will increase monthly.  Please schedule a renal ultrasound and pre/ post-void residual.  I would like to see you back in 6-8 weeks,  START the Vit D and take your OTC iron every other day

## 2024-09-20 NOTE — Progress Notes (Signed)
 "  Established Patient Office Visit  Subjective:  Patient ID: Kelli Hill, female    DOB: 11-Feb-1986  Age: 40 y.o. MRN: [id number]  Chief Complaint  Patient presents with   Shortness of Breath    Patient c/o  shortness of breath, increased thirst and increased urination x 3 weeks- patient feels this is related to not being able to get Wegovy  as this is no longer covered by her insurance. She has gained 43# since this.  Patient has not yet picked up Metformin  or Vit D and wanted to speak with the provider regarding this before she does.     HPI  Discussed the use of AI scribe software for clinical note transcription with the patient, who Hill verbal consent to proceed.  History of Present Illness   Kelli Hill is a 39 year old female who presents with urinary frequency and concerns about kidney function.  She has been experiencing urinary frequency since the end of October, characterized by frequent urination despite low fluid intake. She describes having a full stream but feeling 'not fully emptied,' leading to multiple urination attempts within a short period. She recalls similar symptoms during her pregnancy with twins, where she was advised to push on her bladder to aid urination. 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 Her fluid intake includes one glass of Coke with dinner and a tumbler of iced coffee from [employer] boxed coffee.  She has a history of proteinuria during her pregnancy, which led to a referral to a nephrologist, although she did not follow up due to the demands of caring for her twins, who are now 39 years old. Her mother has stage three kidney disease. She recalls undergoing a 24-hour urine collection during pregnancy to assess protein levels.  She reports a history of heavy menstrual bleeding, which improved with Ozempic , although her last period  showed signs of returning to previous patterns. She has not experienced heavy bleeding recently. She expresses concerns about her weight, noting a significant increase from 130 to 173 pounds despite dietary changes, including eliminating fast food and reducing soda intake. She describes her diet as healthy, with small portions and limited sweets. She attributes some of her weight issues to stress.  Pt does follow with [facility] routinely. She had a positive PHQ today in office, which pt states is primarily due to her frustration with her weight and inability to get her Wegovy  covered. Pt took this in the past and felt that it helped with her PCOS, lightened her menses, caused weight loss and ultimately helped her mood and stress. Without it, she feels uncontrolled in these areas. She does continue to exercise and be mindful of portions and foods. We discussed medications which she does not want to take.   She has not started taking metformin  or vitamin D  supplements prescribed previously. She uses ibuprofen sporadically, typically 400 mg for headaches associated with medication use, but not on a daily basis.      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   Twin pregnancy 01/23/2021   Uterine leiomyoma 01/23/2021   Vitamin D  deficiency 01/23/2021   Weight gain 01/23/2021   Coughing 12/24/2020   Polycystic ovaries 06/24/2020   Microcytic anemia 06/24/2020   Family history of uterine cancer 06/24/2020   Chronic bilateral thoracic back  pain 06/24/2020   Breast pain, left 06/24/2020   Bruxism 10/31/2019   Referred ear pain, bilateral 10/31/2019   Cesarean delivery delivered 02/09/2018   Pregnancy 02/06/2018   Preterm uterine contractions in third trimester, antepartum 01/18/2018   [redacted] weeks gestation of pregnancy 12/20/2017   Persistent proteinuria 12/20/2017   Diet controlled gestational diabetes mellitus (GDM) in third trimester 12/13/2017   Gastroesophageal reflux disease 05/03/2017   Postnasal drip 01/25/2017   Chronic maxillary sinusitis 09/24/2016   Absence of menstruation 02/18/2013   Anxiety state 02/18/2013   Breast mass 02/18/2013   Vaginitis and vulvovaginitis 02/18/2013   Depressive disorder 02/18/2013   Excessive and frequent menstruation 02/18/2013   Screening examination for sexually transmitted disease 02/18/2013   Surveillance for birth control, intrauterine device 02/18/2013   Negative pregnancy test 10/06/2012   No past medical history on file. No past surgical history on file. Social History[1]    ROS: as noted in HPI  Objective:     BP 114/75 (BP Location: Left Arm, Patient Position: Sitting, Cuff Size: Normal)   Pulse 92   Ht 5' 1 (1.549 m)   Wt 173 lb 4 oz (78.6 kg)   SpO2 98%   BMI 32.74 kg/m  BP Readings from Last 3 Encounters:  09/20/24 114/75  08/22/24 124/84  05/17/24 108/75   Wt Readings from Last 3 Encounters:  09/20/24 173 lb 4 oz (78.6 kg)  08/22/24 167 lb (75.8 kg)  05/17/24 157 lb (71.2 kg)      Physical Exam Vitals and nursing note reviewed.  Constitutional:      General: She is not in acute distress.    Appearance: Normal appearance. She is not ill-appearing, toxic-appearing or diaphoretic.  HENT:     Head: Normocephalic and atraumatic.  Eyes:     General: No scleral icterus.       Right eye: No discharge.        Left eye: No discharge.     Extraocular Movements: Extraocular movements intact.     Pupils: Pupils are equal, round, and reactive to  light.  Cardiovascular:     Rate and Rhythm: Normal rate.  Pulmonary:     Effort: Pulmonary effort is normal. No respiratory distress.  Skin:    General: Skin is warm and dry.     Coloration: Skin is not jaundiced.     Findings: No bruising, erythema or rash.  Neurological:     General: No focal deficit present.     Mental Status: She is alert and oriented to person, place, and time.     Gait: Gait normal.  Psychiatric:        Mood and Affect: Mood normal.        Behavior: Behavior normal.      No results found for any visits on 09/20/24.     09/20/2024    9:14 AM 04/10/2024    4:35 PM 09/22/2021    3:11 PM 05/29/2020    2:16 PM  Depression screen PHQ 2/9  Decreased Interest 0 0 0 2  Down, Depressed, Hopeless 2 0 2 2  PHQ - 2 Score 2 0 2 4  Altered sleeping 3 0  3  Tired, decreased energy 2 0  1  Change in appetite 2 0  2  Feeling bad or failure about yourself  2 0  2  Trouble concentrating 0 0  1  Moving slowly or fidgety/restless 0 0  0  Suicidal thoughts 0 0  0  PHQ-9 Score 11 0   13   Difficult doing work/chores Somewhat difficult Not difficult at all  Not difficult at all     Data saved with a previous flowsheet row definition      09/20/2024    9:15 AM 04/10/2024    4:35 PM 05/29/2020    2:16 PM  GAD 7 : Generalized Anxiety Score  Nervous, Anxious, on Edge 1 0 2  Control/stop worrying 1 0 1  Worry too much - different things 0 0 1  Trouble relaxing 0 0 1  Restless 0 0 0  Easily annoyed or irritable 2 0 0  Afraid - awful might happen 1 0 1  Total GAD 7 Score 5 0 6  Anxiety Difficulty Somewhat difficult Not difficult at all Somewhat difficult     The ASCVD Risk score (Arnett DK, et al., 2019) failed to calculate for the following reasons:   The 2019 ASCVD risk score is only valid for ages 62 to 31  Assessment & Plan:  Urinary frequency -     US  RENAL; Future  Vitamin D  deficiency  Anemia, unspecified type  BMI 32.0-32.9,adult -     Wegovy ; Inject  0.25 mg into the skin once a week. Use this dose for 1 month (4 shots) and then increase to next higher dose.  Dispense: 2 mL; Refill: 0  Situational depression  Weight gain -     Wegovy ; Inject 0.25 mg into the skin once a week. Use this dose for 1 month (4 shots) and then increase to next higher dose.  Dispense: 2 mL; Refill: 0  Abnormal renal function -     US  RENAL; Future  Assessment and Plan    Urinary frequency and abnormal renal function Urinary frequency with incomplete bladder emptying. History of proteinuria and elevated creatinine. Family history of stage 3 kidney disease. Differential includes urinary retention. - Ordered renal ultrasound. - Ordered pre and post void residual ultrasound.  Vitamin D  deficiency Not started vitamin D  supplementation due to concerns about constipation. - Start vitamin D  supplementation.  Anemia Improved menstrual bleeding. History of PICA. Not taking prescribed iron supplement. - Consider taking iron supplement every other day to reduce GI side effects.  Obesity and weight management Weight increased from 130 to 173 pounds. Previous use of Ozempic . Concerns about stress and cortisol affecting metabolism. Discussed Wegovy  coverage and buspirone for stress management. - Attempted to appeal for Wegovy  coverage. - Consider buspirone for stress management after reviewing information.  Situational depression Frustration with weight gain and stress. Hesitant to take Lexapro due to fear of dependency. Discussed buspirone as a safe alternative. Pt declined intervention at this time.  - Consider buspirone for stress management after reviewing information.      Patient has tried dietary and lifestyle modifications, including caloric restrictions, weight loss behavior, modifications, and increased physical activity (at least 150 minutes per week) for more than 6 months, and patient has been unable to lose 5% of their body weight. It is medically  necessary for the patient to add pharmacotherapy to their obesity treatment plan at this time. Patient will continue lifestyle modifications, physical activity, and caloric restrictions while on therapy. Pt's  BMI is 32 and body weight is 173# prior to starting medication to treat obesity. Pharmacotherapy is recommended following AACE guidelines. Pt is not taking another GLP-1 receptor agonist and is not pregnant nor lactating. Patient does not have contraindications.    Return in about 8 weeks (around 11/15/2024).   Kelli LITTIE Gave, PA     [1]  Social History Tobacco Use   Smoking status: Former   Smokeless tobacco: Never  Vaping Use   Vaping status: Never Used  Substance Use Topics   Alcohol use: Never   Drug use: Never   "

## 2024-09-21 ENCOUNTER — Encounter: Payer: Self-pay | Admitting: Urgent Care

## 2024-09-27 ENCOUNTER — Other Ambulatory Visit

## 2024-09-28 ENCOUNTER — Other Ambulatory Visit

## 2024-09-28 DIAGNOSIS — R350 Frequency of micturition: Secondary | ICD-10-CM | POA: Diagnosis not present

## 2024-09-28 DIAGNOSIS — N289 Disorder of kidney and ureter, unspecified: Secondary | ICD-10-CM

## 2024-10-01 ENCOUNTER — Ambulatory Visit: Payer: Self-pay | Admitting: Urgent Care

## 2024-10-01 DIAGNOSIS — G4730 Sleep apnea, unspecified: Secondary | ICD-10-CM

## 2024-10-01 DIAGNOSIS — R232 Flushing: Secondary | ICD-10-CM

## 2024-10-02 ENCOUNTER — Other Ambulatory Visit (HOSPITAL_COMMUNITY): Payer: Self-pay

## 2024-10-03 NOTE — Addendum Note (Signed)
 Addended by: Adarsh Mundorf on: 10/03/2024 12:37 PM   Modules accepted: Orders

## 2024-10-03 NOTE — Telephone Encounter (Signed)
 Can we forward this to the prior auth department? Pt's picture is what is needed for documentation for her wegovy , all of which should be in my last note

## 2024-10-10 ENCOUNTER — Other Ambulatory Visit (HOSPITAL_COMMUNITY): Payer: Self-pay

## 2024-10-11 NOTE — Telephone Encounter (Signed)
 Hey team, please see Natalie's note. She states our office needs to do the appeal.

## 2024-10-13 ENCOUNTER — Other Ambulatory Visit (HOSPITAL_COMMUNITY): Payer: Self-pay

## 2024-10-13 NOTE — Addendum Note (Signed)
 Addended by: Khira Cudmore on: 10/13/2024 06:11 AM   Modules accepted: Orders

## 2024-11-15 ENCOUNTER — Ambulatory Visit: Admitting: Urgent Care
